# Patient Record
Sex: Male | Born: 1965 | Race: White | Hispanic: No | Marital: Single | Smoking: Former smoker
Health system: Southern US, Community
[De-identification: ages and names within clinical notes are randomized; demographics above are authoritative.]

## PROBLEM LIST (undated history)

## (undated) DIAGNOSIS — F32A Depression, unspecified: Secondary | ICD-10-CM

## (undated) DIAGNOSIS — F329 Major depressive disorder, single episode, unspecified: Secondary | ICD-10-CM

## (undated) DIAGNOSIS — J449 Chronic obstructive pulmonary disease, unspecified: Secondary | ICD-10-CM

## (undated) HISTORY — DX: Depression, unspecified: F32.A

## (undated) HISTORY — DX: Major depressive disorder, single episode, unspecified: F32.9

## (undated) HISTORY — DX: Chronic obstructive pulmonary disease, unspecified: J44.9

---

## 2000-09-25 ENCOUNTER — Encounter: Payer: Self-pay | Admitting: Internal Medicine

## 2000-09-25 ENCOUNTER — Emergency Department (HOSPITAL_COMMUNITY): Admission: EM | Admit: 2000-09-25 | Discharge: 2000-09-25 | Payer: Self-pay | Admitting: *Deleted

## 2010-10-14 ENCOUNTER — Emergency Department (HOSPITAL_COMMUNITY): Admission: EM | Admit: 2010-10-14 | Discharge: 2010-10-14 | Payer: Self-pay | Admitting: Emergency Medicine

## 2010-10-24 ENCOUNTER — Ambulatory Visit (HOSPITAL_COMMUNITY)
Admission: RE | Admit: 2010-10-24 | Discharge: 2010-10-24 | Payer: Self-pay | Source: Home / Self Care | Admitting: Orthopedic Surgery

## 2011-01-31 LAB — URINALYSIS, ROUTINE W REFLEX MICROSCOPIC
Bilirubin Urine: NEGATIVE
Glucose, UA: NEGATIVE mg/dL
Hgb urine dipstick: NEGATIVE
Ketones, ur: NEGATIVE mg/dL
Nitrite: NEGATIVE
Protein, ur: NEGATIVE mg/dL
Specific Gravity, Urine: 1.028 (ref 1.005–1.030)
Urobilinogen, UA: 0.2 mg/dL (ref 0.0–1.0)
pH: 5.5 (ref 5.0–8.0)

## 2011-09-09 IMAGING — US US SCROTUM
1 series · 14 of 25 positions shown · non-contrast
Comparison: None.

CLINICAL DATA: Left scrotal tingling for 3 months; back pain.

SCROTAL ULTRASOUND
DOPPLER ULTRASOUND OF THE TESTICLES
TECHNIQUE: Complete ultrasound examination of the testicles,
epididymis, and other scrotal structures was performed.  Color and
spectral Doppler ultrasound were also utilized to evaluate blood
flow to the testicles.

[Series 1: us scrotum · 0.07mm/px · 14 of 29 slices shown]
[im 1/29]
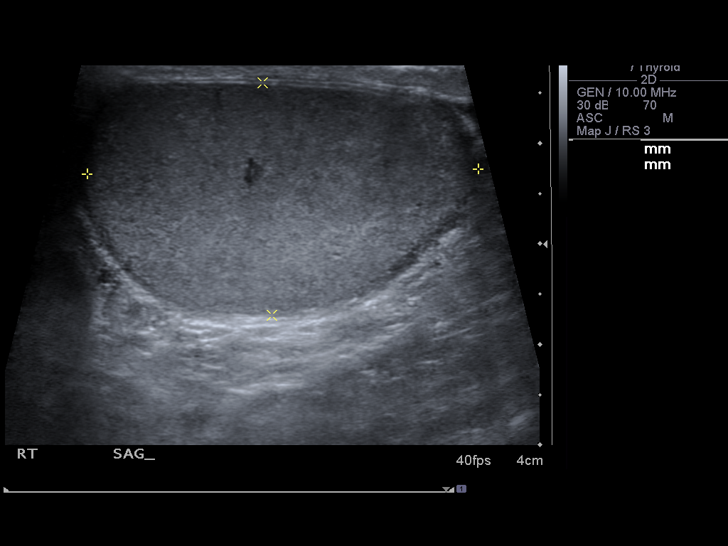
[im 3/29]
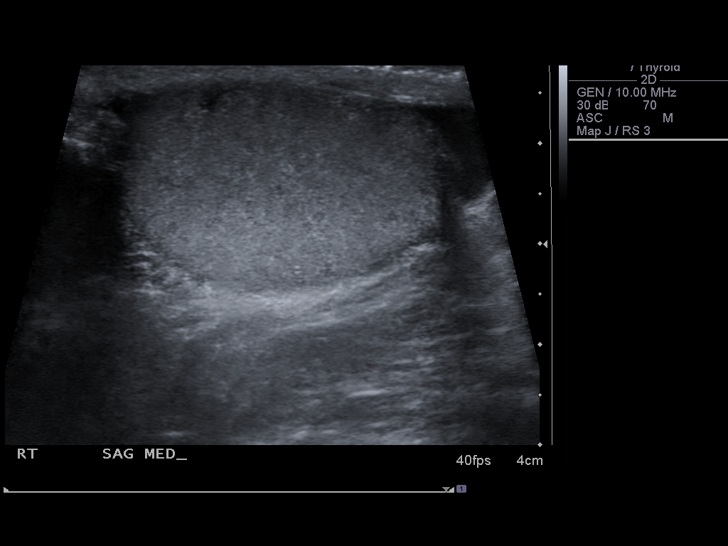
[im 5/29]
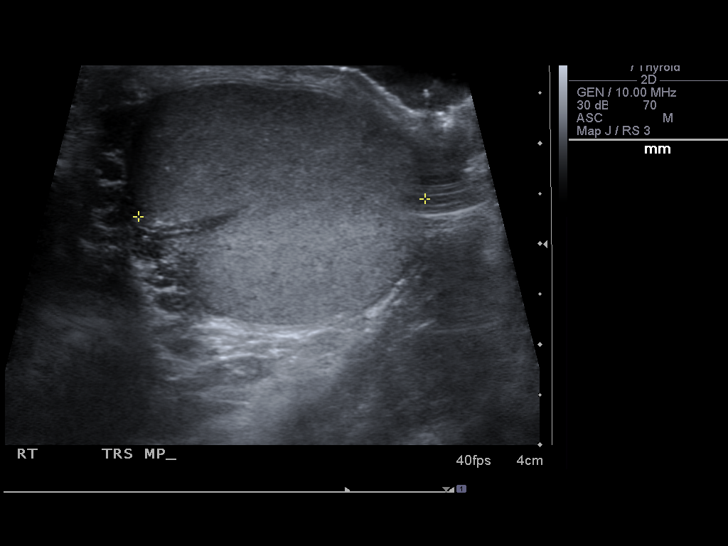
[im 8/29]
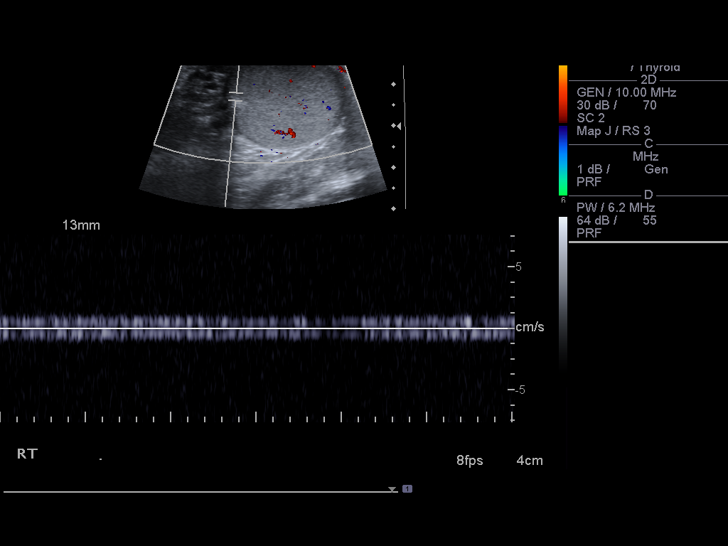
[im 10/29]
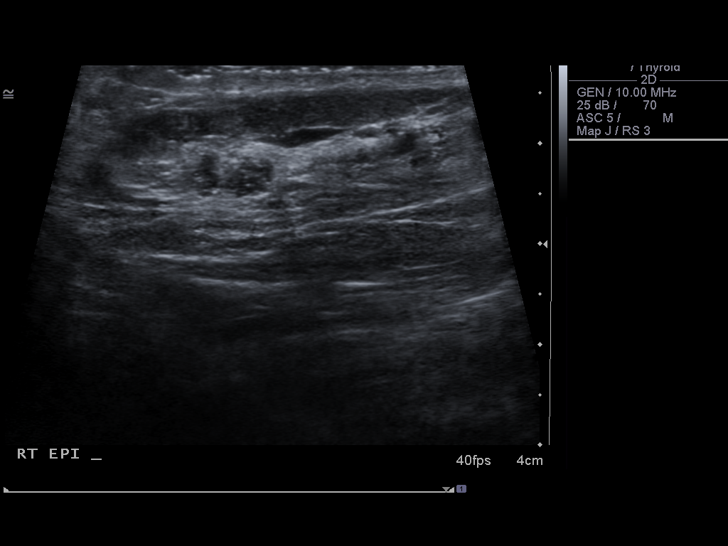
[im 11/29]
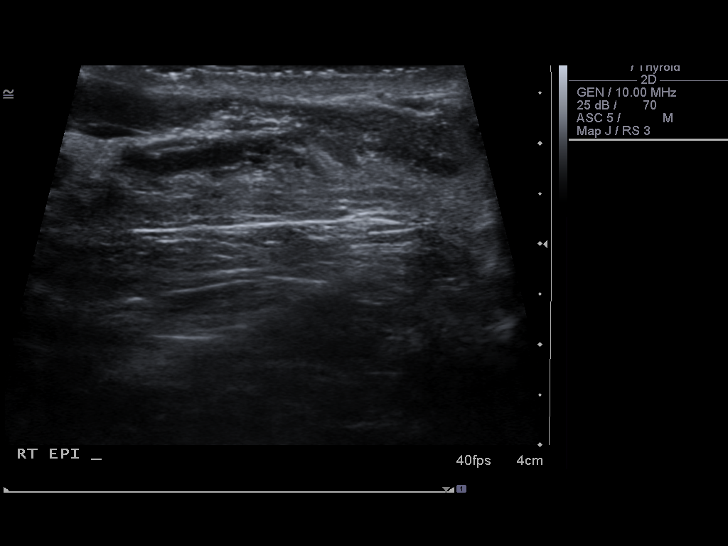
[im 13/29]
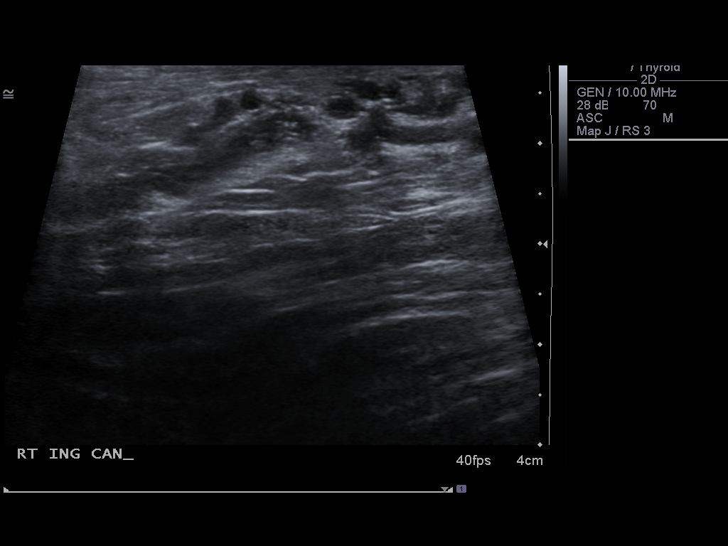
[im 16/29]
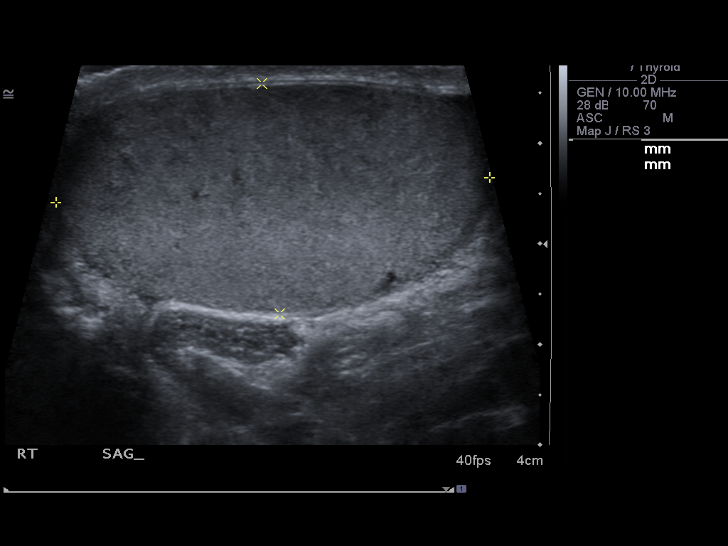
[im 18/29]
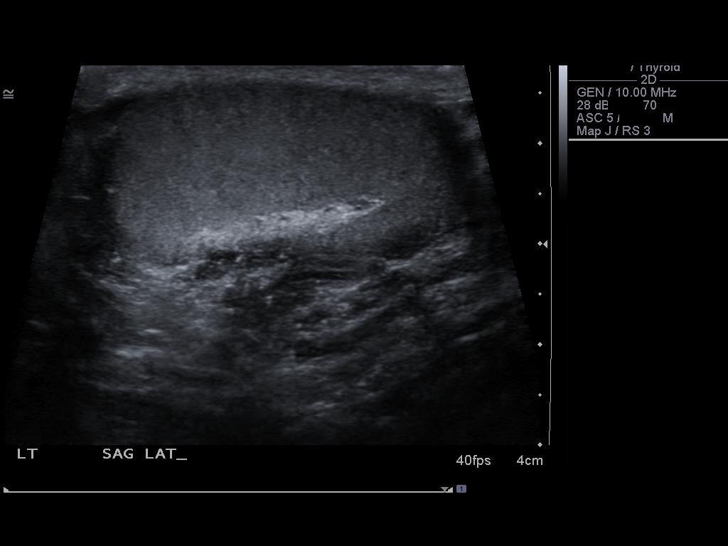
[im 19/29]
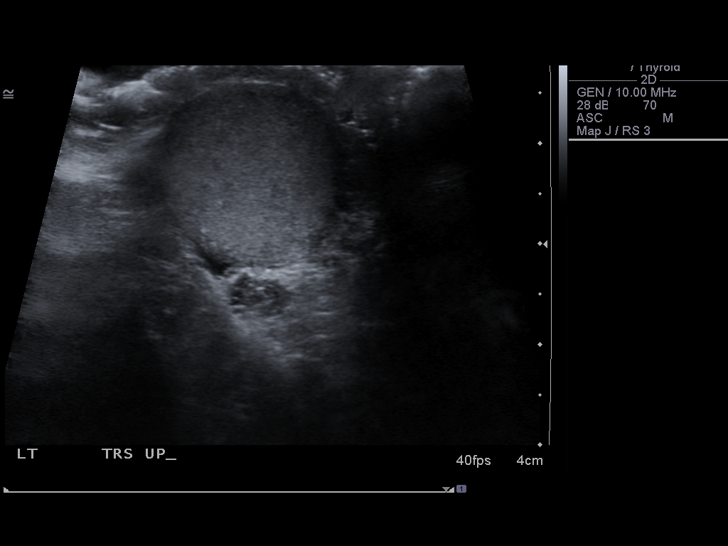
[im 22/29]
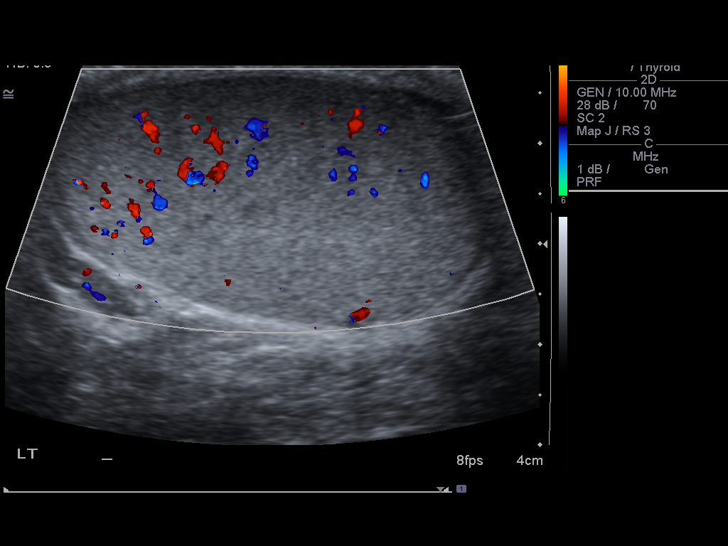
[im 24/29]
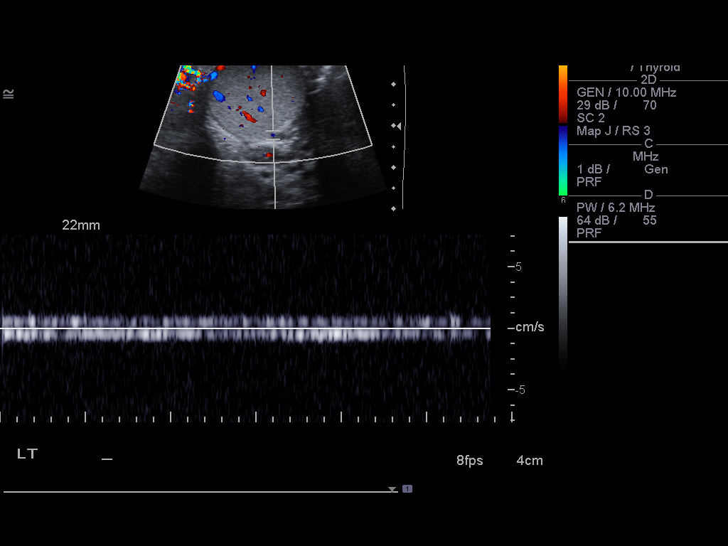
[im 26/29]
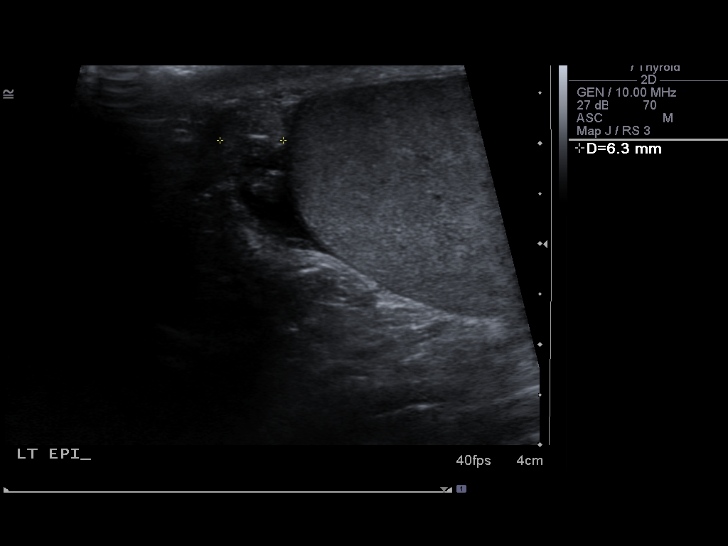
[im 29/29]
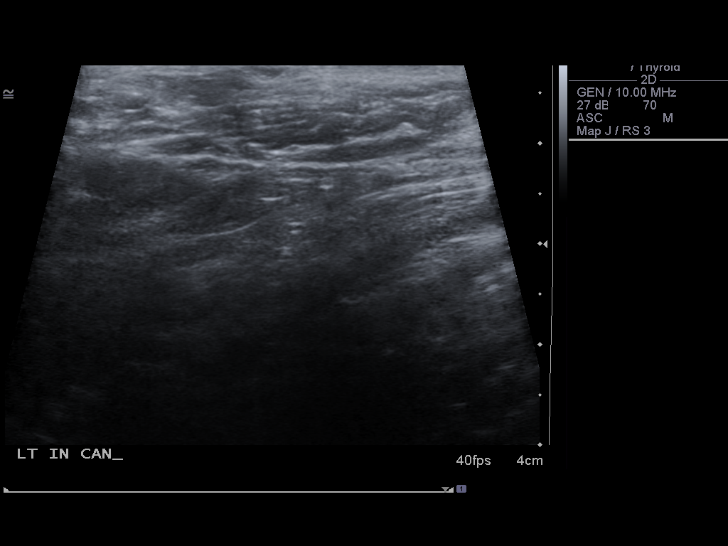

[14 of 25 positions shown; findings below may reference images not displayed]

FINDINGS: The testicles are symmetric in size and echogenicity.
The right testis measures 3.9 x 2.3 x 2.9 cm, while the left testis
measures 4.3 x 2.3 x 2.6 cm.  No testicular masses are seen, and
there is no evidence of microlithiasis.  The right epididymal head
is unremarkable in appearance.  A 7 mm anechoic cyst is noted
within the left epididymal head; the epididymides are otherwise
unremarkable.

There is no evidence of hydrocele, varicocele, or other extra-
testicular abnormality.

Blood flow is seen within both testicles on color Doppler
sonography.  Doppler spectral waveforms show both arterial and
venous flow signal in both testicles.
IMPRESSION: 1.  No evidence of testicular torsion.
2.  Left epididymal head cyst noted.

Critical test results telephoned to Dr. Ketia Saha at the time of

## 2013-03-05 ENCOUNTER — Ambulatory Visit (INDEPENDENT_AMBULATORY_CARE_PROVIDER_SITE_OTHER): Payer: BC Managed Care – PPO | Admitting: Emergency Medicine

## 2013-03-05 VITALS — BP 144/90 | HR 80 | Temp 98.3°F | Resp 18 | Wt 300.0 lb

## 2013-03-05 DIAGNOSIS — J309 Allergic rhinitis, unspecified: Secondary | ICD-10-CM

## 2013-03-05 MED ORDER — MOMETASONE FUROATE 50 MCG/ACT NA SUSP: 4.0000 | Freq: Every day | NASAL | Status: DC

## 2013-03-05 MED ORDER — FEXOFENADINE HCL 180 MG PO TABS: 180.0000 mg | ORAL_TABLET | Freq: Every day | ORAL | Status: AC

## 2013-03-05 NOTE — Progress Notes (Signed)
[facility] [address], [place] [address] [phone]  Date:  03/05/2013   Name:  Stanley Villarreal   DOB:  01/18/1966   MRN:  [id number]  PCP:  No primary provider on file.    Chief Complaint: Sinusitis and Sore Throat   History of Present Illness:  Stanley Villarreal is a 47 y.o. very pleasant male patient who presents with the following:  Ill past few days with clear nasal drainage and post nasal drainage.  Has sore throat and pain in ears and pressure in his maxillary sinuses.  No fever or chills.  No cough, wheezing or shortness of breath.  No improvement with over the counter medications or other home remedies. Denies other complaint or health concern today.   There is no problem list on file for this patient.   Past Medical History  Diagnosis Date  . Depression     No past surgical history on file.  History  Substance Use Topics  . Smoking status: Former Games developer  . Smokeless tobacco: Not on file  . Alcohol Use: Yes    No family history on file.  No Known Allergies  Medication list has been reviewed and updated.  No current outpatient prescriptions on file prior to visit.   No current facility-administered medications on file prior to visit.    Review of Systems:  As per HPI, otherwise negative.    Physical Examination: Filed Vitals:   03/05/13 1752  BP: 144/90  Pulse: 80  Temp: 98.3 F (36.8 C)  Resp: 18   Filed Vitals:   03/05/13 1752  Weight: 300 lb (136.079 kg)   There is no height on file to calculate BMI. Ideal Body Weight:   GEN: WDWN, NAD, Non-toxic, A & O x 3 HEENT: Atraumatic, Normocephalic. Neck supple. No masses, No LAD. Ears and Nose: No external deformity. CV: RRR, No M/G/R. No JVD. No thrill. No extra heart sounds. PULM: CTA B, no wheezes, crackles, rhonchi. No retractions. No resp. distress. No accessory muscle use. ABD: S, NT, ND, +BS. No rebound. No HSM. EXTR: No c/c/e NEURO Normal gait.  PSYCH:  Normally interactive. Conversant. Not depressed or anxious appearing.  Calm demeanor.    Assessment and Plan: Seasonal allergic rhinitis nasonex Allegra   Signed,  Phillips Odor, MD

## 2013-03-05 NOTE — Patient Instructions (Addendum)
Allergic Rhinitis  Allergic rhinitis is when the mucous membranes in the nose respond to allergens. Allergens are particles in the air that cause your body to have an allergic reaction. This causes you to release allergic antibodies. Through a chain of events, these eventually cause you to release histamine into the blood stream (hence the use of antihistamines). Although meant to be protective to the body, it is this release that causes your discomfort, such as frequent sneezing, congestion and an itchy runny nose.    CAUSES    The pollen allergens may come from grasses, trees, and weeds. This is seasonal allergic rhinitis, or "hay fever." Other allergens cause year-round allergic rhinitis (perennial allergic rhinitis) such as house dust mite allergen, pet dander and mold spores.    SYMPTOMS     Nasal stuffiness (congestion).   Runny, itchy nose with sneezing and tearing of the eyes.   There is often an itching of the mouth, eyes and ears.  It cannot be cured, but it can be controlled with medications.  DIAGNOSIS    If you are unable to determine the offending allergen, skin or blood testing may find it.  TREATMENT     Avoid the allergen.   Medications and allergy shots (immunotherapy) can help.   Hay fever may often be treated with antihistamines in pill or nasal spray forms. Antihistamines block the effects of histamine. There are over-the-counter medicines that may help with nasal congestion and swelling around the eyes. Check with your caregiver before taking or giving this medicine.  If the treatment above does not work, there are many new medications your caregiver can prescribe. Stronger medications may be used if initial measures are ineffective. Desensitizing injections can be used if medications and avoidance fails. Desensitization is when a patient is given ongoing shots until the body becomes less sensitive to the allergen. Make sure you follow up with your caregiver if problems continue.   SEEK MEDICAL CARE IF:     You develop fever (more than 100.5 F (38.1 C).   You develop a cough that does not stop easily (persistent).   You have shortness of breath.   You start wheezing.   Symptoms interfere with normal daily activities.  Document Released: 08/01/2001 Document Revised: 01/29/2012 Document Reviewed: 02/10/2009  ExitCare Patient Information 2013 [employer].

## 2014-03-28 ENCOUNTER — Ambulatory Visit (INDEPENDENT_AMBULATORY_CARE_PROVIDER_SITE_OTHER): Payer: BC Managed Care – PPO | Admitting: Family Medicine

## 2014-03-28 VITALS — BP 136/84 | HR 73 | Temp 98.2°F | Resp 16 | Ht 72.05 in | Wt 213.6 lb

## 2014-03-28 DIAGNOSIS — H5789 Other specified disorders of eye and adnexa: Secondary | ICD-10-CM

## 2014-03-28 DIAGNOSIS — H579 Unspecified disorder of eye and adnexa: Secondary | ICD-10-CM

## 2014-03-28 NOTE — Progress Notes (Signed)
Chief Complaint:  Chief Complaint  Patient presents with  . ?FB to left eye    eye dries out, feels like this moves around x 4 days.      HPI: Stanley Villarreal is a 48 y.o. male who is here for  4 day history of feeling something in his eye, he was helpin his son work on car when this happened, he washed in the shower and though tit was water and chlorine but after4 days he has not gotten beter. He has no eye redness, swelling or pain. Just  afeeling of discomofrt. He   Past Medical History  Diagnosis Date  . Depression    No past surgical history on file. History   Social History  . Marital Status: Single    Spouse Name: N/A    Number of Children: N/A  . Years of Education: N/A   Social History Main Topics  . Smoking status: Former Smoker    Quit date: 08/23/2009  . Smokeless tobacco: None  . Alcohol Use: Yes  . Drug Use: No  . Sexual Activity: None   Other Topics Concern  . None   Social History Narrative  . None   No family history on file. No Known Allergies Prior to Admission medications   Medication Sig Start Date End Date Taking? Authorizing Provider  amphetamine-dextroamphetamine (ADDERALL) 20 MG tablet Take 20 mg by mouth daily.   Yes Historical Provider, MD  citalopram (CELEXA) 40 MG tablet Take 40 mg by mouth daily.   Yes Historical Provider, MD  fexofenadine (ALLEGRA) 180 MG tablet Take 1 tablet (180 mg total) by mouth daily. 03/05/13  Yes Phillips OdorJeffery Anderson, MD  mometasone (NASONEX) 50 MCG/ACT nasal spray Place 4 sprays into the nose daily. 03/05/13  Yes Phillips OdorJeffery Anderson, MD     ROS: The patient denies fevers, chills, night sweats, unintentional weight loss, chest pain, palpitations, wheezing, dyspnea on exertion, nausea, vomiting, abdominal pain, dysuria, hematuria, melena, numbness, weakness, or tingling.   All other systems have been reviewed and were otherwise negative with the exception of those mentioned in the HPI and as above.    PHYSICAL  EXAM: Filed Vitals:   03/28/14 1508  BP: 136/84  Pulse: 73  Temp: 98.2 F (36.8 C)  Resp: 16   Filed Vitals:   03/28/14 1508  Height: 6' 0.05" (1.83 m)  Weight: 213 lb 9.6 oz (96.888 kg)   Body mass index is 28.93 kg/(m^2).  General: Alert, no acute distress HEENT:  Normocephalic, atraumatic, oropharynx patent. EOMI, PERRLA, fundo exam nl, no conjunctivitis, no foreign objects visualized Tm nl Cardiovascular:  Regular rate and rhythm, no rubs murmurs or gallops.  No Carotid bruits, radial pulse intact. No pedal edema.  Respiratory: Clear to auscultation bilaterally.  No wheezes, rales, or rhonchi.  No cyanosis, no use of accessory musculature GI: No organomegaly, abdomen is soft and non-tender, positive bowel sounds.  No masses. Skin: No rashes. Neurologic: Facial musculature symmetric. Psychiatric: Patient is appropriate throughout our interaction. Lymphatic: No cervical lymphadenopathy Musculoskeletal: Gait intact.   LABS: Results for orders placed during the hospital encounter of 10/14/10  URINALYSIS, ROUTINE W REFLEX MICROSCOPIC      Result Value Ref Range   Color, Urine YELLOW  YELLOW   APPearance CLEAR  CLEAR   Specific Gravity, Urine 1.028  1.005 - 1.030   pH 5.5  5.0 - 8.0   Glucose, UA NEGATIVE  NEGATIVE mg/dL   Hgb urine dipstick NEGATIVE  NEGATIVE   Bilirubin Urine NEGATIVE  NEGATIVE   Ketones, ur NEGATIVE  NEGATIVE mg/dL   Protein, ur NEGATIVE  NEGATIVE mg/dL   Urobilinogen, UA 0.2  0.0 - 1.0 mg/dL   Nitrite NEGATIVE  NEGATIVE   Leukocytes, UA    NEGATIVE   Value: NEGATIVE MICROSCOPIC NOT DONE ON URINES WITH NEGATIVE PROTEIN, BLOOD, LEUKOCYTES, NITRITE, OR GLUCOSE <1000 mg/dL.     EKG/XRAY:   [facility]   ASSESSMENT/PLAN: Encounter Diagnosis  Name Primary?  . Irritation of left eye Yes   Fundo exam and fluorscein eaxm normal Did not see any foreign object Flushed and gave 2 drops of abx He can call if needs optho  referral or F/u prn  Gross sideeffects, risk and benefits, and alternatives of medications d/w patient. Patient is aware that all medications have potential sideeffects and we are unable to predict every sideeffect or drug-drug interaction that may occur.  Lenell Antuhao P Warnell Rasnic, DO 03/28/2014 4:29 PM

## 2014-04-25 ENCOUNTER — Ambulatory Visit (INDEPENDENT_AMBULATORY_CARE_PROVIDER_SITE_OTHER): Payer: BC Managed Care – PPO | Admitting: Physician Assistant

## 2014-04-25 VITALS — BP 124/82 | HR 87 | Temp 98.1°F | Resp 16 | Ht 73.0 in | Wt 310.0 lb

## 2014-04-25 DIAGNOSIS — H6980 Other specified disorders of Eustachian tube, unspecified ear: Secondary | ICD-10-CM

## 2014-04-25 DIAGNOSIS — R059 Cough, unspecified: Secondary | ICD-10-CM

## 2014-04-25 DIAGNOSIS — Q178 Other specified congenital malformations of ear: Secondary | ICD-10-CM

## 2014-04-25 DIAGNOSIS — R05 Cough: Secondary | ICD-10-CM

## 2014-04-25 MED ORDER — AMOXICILLIN-POT CLAVULANATE 875-125 MG PO TABS: 1.0000 | ORAL_TABLET | Freq: Two times a day (BID) | ORAL | Status: DC

## 2014-04-25 MED ORDER — PREDNISONE 20 MG PO TABS: ORAL_TABLET | ORAL | Status: DC

## 2014-04-25 MED ORDER — HYDROCODONE-HOMATROPINE 5-1.5 MG/5ML PO SYRP: ORAL_SOLUTION | ORAL | Status: DC

## 2014-04-25 NOTE — Progress Notes (Signed)
Subjective:    Patient ID: Stanley Villarreal, male    DOB: [birth year], 48 y.o.   MRN: [id number]  HPI Primary Physician: No primary provider on file.  Chief Complaint: URI x 2 weeks  HPI: 48 y.o. male with history below presents with 2 week history of nasal congestion, sore throat, muffled hearing, cough, fever, chills, and fatigue. Cough is productive of bright yellow to brown sputum and not associated with time of day. No SOB or wheezing. Cough seems to be coming from post nasal drip. Having to say "what" or "huh" frequently. Ears will pop a little. Sounds like he has had frequent issues with eustachian tubes.    No known sick contacts. Has been taking Mucinex with good results.     Past Medical History  Diagnosis Date  . Depression      Home Meds: Prior to Admission medications   Medication Sig Start Date End Date Taking? Authorizing Provider  amphetamine-dextroamphetamine (ADDERALL) 20 MG tablet Take 20 mg by mouth daily.   Yes Historical Provider, MD  citalopram (CELEXA) 40 MG tablet Take 40 mg by mouth daily.   Yes Historical Provider, MD  fexofenadine (ALLEGRA) 180 MG tablet Take 1 tablet (180 mg total) by mouth daily. 03/05/13  Yes Phillips Odor, MD  mometasone (NASONEX) 50 MCG/ACT nasal spray Place 4 sprays into the nose daily. 03/05/13   Phillips Odor, MD    Allergies: No Known Allergies  History   Social History  . Marital Status: Single    Spouse Name: N/A    Number of Children: N/A  . Years of Education: N/A   Occupational History  . Not on file.   Social History Main Topics  . Smoking status: Former Smoker    Quit date: 08/23/2009  . Smokeless tobacco: Not on file  . Alcohol Use: Yes  . Drug Use: No  . Sexual Activity: Not on file   Other Topics Concern  . Not on file   Social History Narrative  . No narrative on file     Review of Systems  Constitutional: Positive for fever, chills and fatigue. Negative for appetite change.  HENT: Positive  for congestion, hearing loss, postnasal drip, sore throat and tinnitus. Negative for ear pain, rhinorrhea and sinus pressure.        Scratchy throat.    Respiratory: Positive for cough. Negative for shortness of breath and wheezing.        Cough is productive of bright yellow to brown. Cough is not associated with time of day.   Gastrointestinal: Negative for nausea, vomiting and diarrhea.  Musculoskeletal: Negative for myalgias.  Neurological: Negative for headaches.       Objective:   Physical Exam  Physical Exam: Blood pressure 124/82, pulse 87, temperature 98.1 F (36.7 C), temperature source Oral, resp. rate 16, height 6\' 1"  (1.854 m), weight 310 lb (140.615 kg), SpO2 96.00%., Body mass index is 40.91 kg/(m^2). General: Well developed, well nourished, in no acute distress. Head: Normocephalic, atraumatic, eyes without discharge, sclera non-icteric, nares are without discharge. Bilateral auditory canals clear, TM's are without perforation, pearly grey and translucent with reflective cone of light bilaterally and retracted. Mild left sided maxillary sinus TTP. Oral cavity moist, posterior pharynx with post nasal drip. No exudate, erythema, or peritonsillar abscess. Uvula midline.   Neck: Supple. No thyromegaly. Full ROM. No lymphadenopathy. No nuchal rigidity.  Lungs: Clear bilaterally to auscultation without wheezes, rales, or rhonchi. Breathing is unlabored. Heart: RRR with S1 S2. No  murmurs, rubs, or gallops appreciated. Msk:  Strength and tone normal for age. Extremities/Skin: Warm and dry. No clubbing or cyanosis. No edema. No rashes or suspicious lesions. Neuro: Alert and oriented X 3. Moves all extremities spontaneously. Gait is normal. CNII-XII grossly in tact. Psych:  Responds to questions appropriately with a normal affect.        Assessment & Plan:  48 year old male with eustachian tube dysfunction and cough -Augmentin 875/125 mg 1 po bid #20 no RF -Prednisone 20 mg #18  3x3, 2x3, 1x3 no RF, SED -Hycodan #4oz 1 tsp po q 4-6 hours prn cough no RF, SED -Rest -RTC precautions   Eula Listenyan Todrick Siedschlag, MHS, PA-C [facility] [address] [place], [place] [address] [phone] [facility] 04/25/2014 3:15 PM

## 2015-01-10 ENCOUNTER — Ambulatory Visit (INDEPENDENT_AMBULATORY_CARE_PROVIDER_SITE_OTHER): Payer: BLUE CROSS/BLUE SHIELD | Admitting: Urgent Care

## 2015-01-10 VITALS — BP 130/82 | HR 85 | Temp 98.2°F | Resp 20 | Ht 73.0 in | Wt 321.0 lb

## 2015-01-10 DIAGNOSIS — R0982 Postnasal drip: Secondary | ICD-10-CM

## 2015-01-10 DIAGNOSIS — H6983 Other specified disorders of Eustachian tube, bilateral: Secondary | ICD-10-CM

## 2015-01-10 DIAGNOSIS — Z889 Allergy status to unspecified drugs, medicaments and biological substances status: Secondary | ICD-10-CM

## 2015-01-10 DIAGNOSIS — J029 Acute pharyngitis, unspecified: Secondary | ICD-10-CM

## 2015-01-10 LAB — POCT CBC
Granulocyte percent: 56.6 %G (ref 37–80)
HCT, POC: 46.4 % (ref 43.5–53.7)
HEMOGLOBIN: 15.5 g/dL (ref 14.1–18.1)
Lymph, poc: 2.9 (ref 0.6–3.4)
MCH: 30.8 pg (ref 27–31.2)
MCHC: 33.4 g/dL (ref 31.8–35.4)
MCV: 92.2 fL (ref 80–97)
MID (cbc): 0.6 (ref 0–0.9)
MPV: 7.8 fL (ref 0–99.8)
POC GRANULOCYTE: 4.6 (ref 2–6.9)
POC LYMPH %: 35.7 % (ref 10–50)
POC MID %: 7.7 % (ref 0–12)
Platelet Count, POC: 273 10*3/uL (ref 142–424)
RBC: 5.03 M/uL (ref 4.69–6.13)
RDW, POC: 14.5 %
WBC: 8.1 10*3/uL (ref 4.6–10.2)

## 2015-01-10 MED ORDER — CETIRIZINE HCL 10 MG PO TABS: 10.0000 mg | ORAL_TABLET | Freq: Every day | ORAL | Status: AC

## 2015-01-10 MED ORDER — TRIAMCINOLONE ACETONIDE 55 MCG/ACT NA AERO: 2.0000 | INHALATION_SPRAY | Freq: Every day | NASAL | Status: AC

## 2015-01-10 NOTE — Progress Notes (Signed)
MRN: [id number] DOB: 19-Jan-1966  Subjective:   Stanley Villarreal is a 49 y.o. male presenting for chief complaint of Sore Throat  Reports a 2 week history of sore throat. Was steady until yesterday, started getting worse after a sneezing fit. Has tried gargling saltwater with minimal relief. Denies fevers, sinus congestion, sinus pain, ear pain, ear drainage, cough, chest pain, shob, wheezing, abdominal pain, n/v. Wife also has cold symptoms. Has a history of allergies, managed with Allegra but admits he is not consistent with this, admits he has to pop his ears regularly. Denies history of GERD, asthma. No smoking, 6-8 beers per week. Denies any other aggravating or relieving factors, no other questions or concerns.  Stanley Villarreal has a current medication list which includes the following prescription(s): amphetamine-dextroamphetamine, citalopram, and fexofenadine.  He has No Known Allergies.  Stanley Villarreal  has a past medical history of Depression. Also  has no past surgical history on file.  ROS As in subjective.  Objective:   Vitals: BP 130/82 mmHg  Pulse 85  Temp(Src) 98.2 F (36.8 C) (Oral)  Resp 20  Ht 6\' 1"  (1.854 m)  Wt 321 lb (145.605 kg)  BMI 42.36 kg/m2  SpO2 96%  Physical Exam  Constitutional: He is oriented to person, place, and time and well-developed, well-nourished, and in no distress.  HENT:  TM's flat bilaterally, no effusions or erythema. Nares patent, nasal turbinates pink and moist, also slightly edematous, small amount of clear rhinorrhea. Postnasal drip present, without tonsillar enlargement or oropharyngeal exudates. Mucous membranes moist.  Eyes: Conjunctivae are normal. Right eye exhibits no discharge. Left eye exhibits no discharge. No scleral icterus.  Neck: Neck supple.  Cardiovascular: Normal rate, regular rhythm, normal heart sounds and intact distal pulses.  Exam reveals no gallop and no friction rub.   No murmur heard. Pulmonary/Chest: Effort normal and  breath sounds normal. No stridor. No respiratory distress. He has no wheezes. He has no rales. He exhibits no tenderness.  Lymphadenopathy:    He has no cervical adenopathy.  Neurological: He is alert and oriented to person, place, and time.  Skin: Skin is warm and dry. No rash noted. No erythema. No pallor.   Results for orders placed or performed in visit on 01/10/15 (from the past 24 hour(s))  POCT CBC     Status: None   Collection Time: 01/10/15  2:24 PM  Result Value Ref Range   WBC 8.1 4.6 - 10.2 K/uL   Lymph, poc 2.9 0.6 - 3.4   POC LYMPH PERCENT 35.7 10 - 50 %L   MID (cbc) 0.6 0 - 0.9   POC MID % 7.7 0 - 12 %M   POC Granulocyte 4.6 2 - 6.9   Granulocyte percent 56.6 37 - 80 %G   RBC 5.03 4.69 - 6.13 M/uL   Hemoglobin 15.5 14.1 - 18.1 g/dL   HCT, POC 14.746.4 82.943.5 - 53.7 %   MCV 92.2 80 - 97 fL   MCH, POC 30.8 27 - 31.2 pg   MCHC 33.4 31.8 - 35.4 g/dL   RDW, POC 56.214.5 %   Platelet Count, POC 273 142 - 424 K/uL   MPV 7.8 0 - 99.8 fL   Assessment and Plan :   1. Sore throat 2. Postnasal drip 3. History of seasonal allergies 4. Eustachian tube dysfunction, bilateral - Physical exam findings and CBC reassuring for r/o infectious process - Advised better control of allergies since sore throat likely due to postnasal drip secondary to  allergies - Counseled patient on ETD - Start nasal steroid, switch to Zyrtec for better allergy control; advised to avoid having his dogs, who spend plenty of time outdoors in his bedroom - Follow up in 2 weeks if no symptom improvement or sooner if worsening symptoms develop  Wallis Bamberg, PA-C [facility] [phone] 01/10/2015 2:28 PM

## 2015-01-11 ENCOUNTER — Encounter: Payer: Self-pay | Admitting: Urgent Care

## 2015-02-03 ENCOUNTER — Ambulatory Visit (INDEPENDENT_AMBULATORY_CARE_PROVIDER_SITE_OTHER): Payer: BLUE CROSS/BLUE SHIELD | Admitting: Family Medicine

## 2015-02-03 VITALS — BP 148/98 | HR 80 | Temp 98.1°F | Resp 18 | Ht 72.5 in | Wt 311.0 lb

## 2015-02-03 DIAGNOSIS — J029 Acute pharyngitis, unspecified: Secondary | ICD-10-CM | POA: Diagnosis not present

## 2015-02-03 DIAGNOSIS — J069 Acute upper respiratory infection, unspecified: Secondary | ICD-10-CM | POA: Diagnosis not present

## 2015-02-03 LAB — POCT RAPID STREP A (OFFICE): Rapid Strep A Screen: NEGATIVE

## 2015-02-03 MED ORDER — AMOXICILLIN-POT CLAVULANATE 875-125 MG PO TABS: 1.0000 | ORAL_TABLET | Freq: Two times a day (BID) | ORAL | Status: AC

## 2015-02-03 NOTE — Patient Instructions (Signed)

## 2015-02-03 NOTE — Progress Notes (Signed)
Chief Complaint:  Chief Complaint  Patient presents with  . Sore Throat    no better    HPI: Stanley Villarreal is a 49 y.o. male who is here for sore throat 21st 2016. His told to take Zyrtec and steroid nasal spray and that has not worked. He had necrotic and has had upper respiratory symptoms, nasal congestion and also diarrhea. He was getting better with that lasts 3-4 days he hasn't had much symptoms of fevers or diarrhea. His nasal drainage and also throat pain has worsened. Today worse while he was eating. He doesn't think that anything got stuck in it. But he felt like his heart is a little bit restricted at that time he is back to normal now. He had worsening tenderness on palpation just after lunch. Now it is better. He denies any voice changes, shortness of breath, wheezing, chest pain. He has left-sided ear pressure.  Past Medical History  Diagnosis Date  . Depression    History reviewed. No pertinent past surgical history. History   Social History  . Marital Status: Single    Spouse Name: N/A  . Number of Children: N/A  . Years of Education: N/A   Social History Main Topics  . Smoking status: Former Smoker    Quit date: 08/23/2009  . Smokeless tobacco: Never Used  . Alcohol Use: 0.0 oz/week    0 Standard drinks or equivalent per week  . Drug Use: No  . Sexual Activity: Not on file   Other Topics Concern  . None   Social History Narrative   History reviewed. No pertinent family history. No Known Allergies Prior to Admission medications   Medication Sig Start Date End Date Taking? Authorizing Provider  amphetamine-dextroamphetamine (ADDERALL) 20 MG tablet Take 20 mg by mouth daily.   Yes Historical Provider, MD  citalopram (CELEXA) 40 MG tablet Take 40 mg by mouth daily.   Yes Historical Provider, MD  triamcinolone (NASACORT AQ) 55 MCG/ACT AERO nasal inhaler Place 2 sprays into the nose daily. 01/10/15  Yes Wallis Bamberg, PA-C  cetirizine (ZYRTEC) 10 MG tablet  Take 1 tablet (10 mg total) by mouth daily. Patient not taking: Reported on 02/03/2015 01/10/15   Wallis Bamberg, PA-C  fexofenadine (ALLEGRA) 180 MG tablet Take 1 tablet (180 mg total) by mouth daily. Patient not taking: Reported on 02/03/2015 03/05/13   Carmelina Dane, MD     ROS: The patient denies night sweats, unintentional weight loss, chest pain, palpitations, wheezing, dyspnea on exertion, nausea, vomiting, abdominal pain, dysuria, hematuria, melena, numbness, weakness, or tingling.   All other systems have been reviewed and were otherwise negative with the exception of those mentioned in the HPI and as above.    PHYSICAL EXAM: Filed Vitals:   02/03/15 1606  BP:   Pulse: 80  Temp:   Resp:    Filed Vitals:   02/03/15 1506  Height: 6' 0.5" (1.842 m)  Weight: 311 lb (141.069 kg)   Body mass index is 41.58 kg/(m^2).  General: Alert, no acute distress HEENT:  Normocephalic, atraumatic, oropharynx patent. EOMI, PERRLA Erythematous throat, no exudates, TM normal, +/- sinus tenderness, + erythematous/boggy nasal mucosa Cardiovascular:  Regular rate and rhythm, no rubs murmurs or gallops.  No Carotid bruits, radial pulse intact. No pedal edema.  Respiratory: Clear to auscultation bilaterally.  No wheezes, rales, or rhonchi.  No cyanosis, no use of accessory musculature GI: No organomegaly, abdomen is soft and non-tender, positive bowel sounds.  No masses. Skin: No rashes. Neurologic: Facial musculature symmetric. Psychiatric: Patient is appropriate throughout our interaction. Lymphatic: No cervical lymphadenopathy Musculoskeletal: Gait intact.   LABS: Results for orders placed or performed in visit on 02/03/15  POCT rapid strep A  Result Value Ref Range   Rapid Strep A Screen Negative Negative     EKG/XRAY:   Primary read [facility]   ASSESSMENT/PLAN: Encounter Diagnoses  Name Primary?  . Sore throat   . Acute upper respiratory infection Yes    Rx Augmentin Cepachol for throat pain No improvement and consider referral to ENT for his sore throat Follow-up when necessary  Gross sideeffects, risk and benefits, and alternatives of medications d/w patient. Patient is aware that all medications have potential sideeffects and we are unable to predict every sideeffect or drug-drug interaction that may occur.  Hamilton CapriLE, Naba Sneed PHUONG, DO 02/03/2015 5:46 PM

## 2015-02-05 LAB — CULTURE, GROUP A STREP: Organism ID, Bacteria: NORMAL

## 2017-12-21 DIAGNOSIS — I10 Essential (primary) hypertension: Secondary | ICD-10-CM | POA: Diagnosis not present

## 2018-01-25 DIAGNOSIS — M109 Gout, unspecified: Secondary | ICD-10-CM | POA: Diagnosis not present

## 2018-01-25 DIAGNOSIS — R062 Wheezing: Secondary | ICD-10-CM | POA: Diagnosis not present

## 2018-01-25 DIAGNOSIS — I10 Essential (primary) hypertension: Secondary | ICD-10-CM | POA: Diagnosis not present

## 2018-01-25 DIAGNOSIS — J449 Chronic obstructive pulmonary disease, unspecified: Secondary | ICD-10-CM | POA: Diagnosis not present

## 2018-02-22 DIAGNOSIS — Z9109 Other allergy status, other than to drugs and biological substances: Secondary | ICD-10-CM | POA: Diagnosis not present

## 2018-02-22 DIAGNOSIS — I10 Essential (primary) hypertension: Secondary | ICD-10-CM | POA: Diagnosis not present

## 2018-02-22 DIAGNOSIS — J449 Chronic obstructive pulmonary disease, unspecified: Secondary | ICD-10-CM | POA: Diagnosis not present

## 2018-03-14 DIAGNOSIS — F419 Anxiety disorder, unspecified: Secondary | ICD-10-CM | POA: Diagnosis not present

## 2018-03-14 DIAGNOSIS — F339 Major depressive disorder, recurrent, unspecified: Secondary | ICD-10-CM | POA: Diagnosis not present

## 2018-03-14 DIAGNOSIS — F909 Attention-deficit hyperactivity disorder, unspecified type: Secondary | ICD-10-CM | POA: Diagnosis not present

## 2018-05-24 DIAGNOSIS — Z131 Encounter for screening for diabetes mellitus: Secondary | ICD-10-CM | POA: Diagnosis not present

## 2018-05-24 DIAGNOSIS — Z0000 Encounter for general adult medical examination without abnormal findings: Secondary | ICD-10-CM | POA: Diagnosis not present

## 2018-05-24 DIAGNOSIS — I10 Essential (primary) hypertension: Secondary | ICD-10-CM | POA: Diagnosis not present

## 2018-05-24 DIAGNOSIS — E782 Mixed hyperlipidemia: Secondary | ICD-10-CM | POA: Diagnosis not present

## 2018-05-24 DIAGNOSIS — J449 Chronic obstructive pulmonary disease, unspecified: Secondary | ICD-10-CM | POA: Diagnosis not present

## 2018-05-24 DIAGNOSIS — Z1211 Encounter for screening for malignant neoplasm of colon: Secondary | ICD-10-CM | POA: Diagnosis not present

## 2018-05-24 DIAGNOSIS — Z23 Encounter for immunization: Secondary | ICD-10-CM | POA: Diagnosis not present

## 2018-06-13 DIAGNOSIS — F330 Major depressive disorder, recurrent, mild: Secondary | ICD-10-CM | POA: Diagnosis not present

## 2018-06-13 DIAGNOSIS — F900 Attention-deficit hyperactivity disorder, predominantly inattentive type: Secondary | ICD-10-CM | POA: Diagnosis not present

## 2018-06-13 DIAGNOSIS — F411 Generalized anxiety disorder: Secondary | ICD-10-CM | POA: Diagnosis not present

## 2018-09-09 DIAGNOSIS — F900 Attention-deficit hyperactivity disorder, predominantly inattentive type: Secondary | ICD-10-CM | POA: Diagnosis not present

## 2018-09-09 DIAGNOSIS — F330 Major depressive disorder, recurrent, mild: Secondary | ICD-10-CM | POA: Diagnosis not present

## 2018-09-09 DIAGNOSIS — F411 Generalized anxiety disorder: Secondary | ICD-10-CM | POA: Diagnosis not present

## 2018-11-27 DIAGNOSIS — R7303 Prediabetes: Secondary | ICD-10-CM | POA: Diagnosis not present

## 2018-11-27 DIAGNOSIS — J449 Chronic obstructive pulmonary disease, unspecified: Secondary | ICD-10-CM | POA: Diagnosis not present

## 2018-11-27 DIAGNOSIS — I10 Essential (primary) hypertension: Secondary | ICD-10-CM | POA: Diagnosis not present

## 2018-12-05 DIAGNOSIS — F411 Generalized anxiety disorder: Secondary | ICD-10-CM | POA: Diagnosis not present

## 2018-12-05 DIAGNOSIS — F900 Attention-deficit hyperactivity disorder, predominantly inattentive type: Secondary | ICD-10-CM | POA: Diagnosis not present

## 2018-12-05 DIAGNOSIS — F331 Major depressive disorder, recurrent, moderate: Secondary | ICD-10-CM | POA: Diagnosis not present

## 2019-06-03 DIAGNOSIS — Z20828 Contact with and (suspected) exposure to other viral communicable diseases: Secondary | ICD-10-CM | POA: Diagnosis not present

## 2019-06-03 DIAGNOSIS — J449 Chronic obstructive pulmonary disease, unspecified: Secondary | ICD-10-CM | POA: Diagnosis not present

## 2019-06-05 DIAGNOSIS — Z20828 Contact with and (suspected) exposure to other viral communicable diseases: Secondary | ICD-10-CM | POA: Diagnosis not present

## 2019-06-05 DIAGNOSIS — J449 Chronic obstructive pulmonary disease, unspecified: Secondary | ICD-10-CM | POA: Diagnosis not present

## 2019-07-02 DIAGNOSIS — E782 Mixed hyperlipidemia: Secondary | ICD-10-CM | POA: Diagnosis not present

## 2019-07-02 DIAGNOSIS — J449 Chronic obstructive pulmonary disease, unspecified: Secondary | ICD-10-CM | POA: Diagnosis not present

## 2019-07-02 DIAGNOSIS — Z0000 Encounter for general adult medical examination without abnormal findings: Secondary | ICD-10-CM | POA: Diagnosis not present

## 2019-07-02 DIAGNOSIS — I10 Essential (primary) hypertension: Secondary | ICD-10-CM | POA: Diagnosis not present

## 2019-07-02 DIAGNOSIS — F902 Attention-deficit hyperactivity disorder, combined type: Secondary | ICD-10-CM | POA: Diagnosis not present

## 2019-08-28 DIAGNOSIS — I10 Essential (primary) hypertension: Secondary | ICD-10-CM | POA: Diagnosis not present

## 2019-08-28 DIAGNOSIS — Z23 Encounter for immunization: Secondary | ICD-10-CM | POA: Diagnosis not present

## 2019-08-28 DIAGNOSIS — R7303 Prediabetes: Secondary | ICD-10-CM | POA: Diagnosis not present

## 2019-08-28 DIAGNOSIS — E782 Mixed hyperlipidemia: Secondary | ICD-10-CM | POA: Diagnosis not present

## 2019-08-28 DIAGNOSIS — Z125 Encounter for screening for malignant neoplasm of prostate: Secondary | ICD-10-CM | POA: Diagnosis not present

## 2019-09-01 DIAGNOSIS — Z1211 Encounter for screening for malignant neoplasm of colon: Secondary | ICD-10-CM | POA: Diagnosis not present

## 2019-11-04 DIAGNOSIS — R05 Cough: Secondary | ICD-10-CM | POA: Diagnosis not present

## 2019-11-04 DIAGNOSIS — R0602 Shortness of breath: Secondary | ICD-10-CM | POA: Diagnosis not present

## 2020-03-09 DIAGNOSIS — F902 Attention-deficit hyperactivity disorder, combined type: Secondary | ICD-10-CM | POA: Diagnosis not present

## 2020-03-09 DIAGNOSIS — J449 Chronic obstructive pulmonary disease, unspecified: Secondary | ICD-10-CM | POA: Diagnosis not present

## 2020-03-09 DIAGNOSIS — I10 Essential (primary) hypertension: Secondary | ICD-10-CM | POA: Diagnosis not present

## 2020-09-07 DIAGNOSIS — Z125 Encounter for screening for malignant neoplasm of prostate: Secondary | ICD-10-CM | POA: Diagnosis not present

## 2020-09-07 DIAGNOSIS — I10 Essential (primary) hypertension: Secondary | ICD-10-CM | POA: Diagnosis not present

## 2020-09-07 DIAGNOSIS — Z23 Encounter for immunization: Secondary | ICD-10-CM | POA: Diagnosis not present

## 2020-09-07 DIAGNOSIS — J449 Chronic obstructive pulmonary disease, unspecified: Secondary | ICD-10-CM | POA: Diagnosis not present

## 2020-09-07 DIAGNOSIS — E782 Mixed hyperlipidemia: Secondary | ICD-10-CM | POA: Diagnosis not present

## 2020-09-07 DIAGNOSIS — R7303 Prediabetes: Secondary | ICD-10-CM | POA: Diagnosis not present

## 2020-09-07 DIAGNOSIS — Z0000 Encounter for general adult medical examination without abnormal findings: Secondary | ICD-10-CM | POA: Diagnosis not present

## 2021-03-15 DIAGNOSIS — F3341 Major depressive disorder, recurrent, in partial remission: Secondary | ICD-10-CM | POA: Diagnosis not present

## 2021-03-15 DIAGNOSIS — R7303 Prediabetes: Secondary | ICD-10-CM | POA: Diagnosis not present

## 2021-03-15 DIAGNOSIS — J449 Chronic obstructive pulmonary disease, unspecified: Secondary | ICD-10-CM | POA: Diagnosis not present

## 2021-03-15 DIAGNOSIS — F902 Attention-deficit hyperactivity disorder, combined type: Secondary | ICD-10-CM | POA: Diagnosis not present

## 2021-03-15 DIAGNOSIS — I10 Essential (primary) hypertension: Secondary | ICD-10-CM | POA: Diagnosis not present

## 2021-09-14 DIAGNOSIS — Z23 Encounter for immunization: Secondary | ICD-10-CM | POA: Diagnosis not present

## 2021-09-14 DIAGNOSIS — M109 Gout, unspecified: Secondary | ICD-10-CM | POA: Diagnosis not present

## 2021-09-14 DIAGNOSIS — R7303 Prediabetes: Secondary | ICD-10-CM | POA: Diagnosis not present

## 2021-09-14 DIAGNOSIS — I10 Essential (primary) hypertension: Secondary | ICD-10-CM | POA: Diagnosis not present

## 2021-09-14 DIAGNOSIS — E782 Mixed hyperlipidemia: Secondary | ICD-10-CM | POA: Diagnosis not present

## 2021-09-14 DIAGNOSIS — Z125 Encounter for screening for malignant neoplasm of prostate: Secondary | ICD-10-CM | POA: Diagnosis not present

## 2021-09-14 DIAGNOSIS — Z0000 Encounter for general adult medical examination without abnormal findings: Secondary | ICD-10-CM | POA: Diagnosis not present

## 2021-09-20 DIAGNOSIS — Z1211 Encounter for screening for malignant neoplasm of colon: Secondary | ICD-10-CM | POA: Diagnosis not present

## 2022-03-22 DIAGNOSIS — I10 Essential (primary) hypertension: Secondary | ICD-10-CM | POA: Diagnosis not present

## 2022-03-22 DIAGNOSIS — F902 Attention-deficit hyperactivity disorder, combined type: Secondary | ICD-10-CM | POA: Diagnosis not present

## 2022-03-22 DIAGNOSIS — R7303 Prediabetes: Secondary | ICD-10-CM | POA: Diagnosis not present

## 2022-03-22 DIAGNOSIS — M109 Gout, unspecified: Secondary | ICD-10-CM | POA: Diagnosis not present

## 2022-07-20 DIAGNOSIS — M545 Low back pain, unspecified: Secondary | ICD-10-CM | POA: Diagnosis not present

## 2022-07-20 DIAGNOSIS — M47816 Spondylosis without myelopathy or radiculopathy, lumbar region: Secondary | ICD-10-CM | POA: Diagnosis not present

## 2022-07-20 DIAGNOSIS — M479 Spondylosis, unspecified: Secondary | ICD-10-CM | POA: Diagnosis not present

## 2022-08-01 ENCOUNTER — Encounter: Payer: Self-pay | Admitting: Physical Therapy

## 2022-08-01 ENCOUNTER — Ambulatory Visit: Payer: BC Managed Care – PPO | Attending: Anesthesiology | Admitting: Physical Therapy

## 2022-08-01 DIAGNOSIS — M5459 Other low back pain: Secondary | ICD-10-CM | POA: Insufficient documentation

## 2022-08-01 DIAGNOSIS — M6281 Muscle weakness (generalized): Secondary | ICD-10-CM | POA: Insufficient documentation

## 2022-08-01 NOTE — Therapy (Signed)
OUTPATIENT PHYSICAL THERAPY THORACOLUMBAR EVALUATION   Patient Name: Stanley Villarreal MRN: [id number] DOB:[birth year], 56 y.o., male Today's Date: 08/01/2022   PT End of Session - 08/02/22 1315     Visit Number 1    Number of Visits 8    Date for PT Re-Evaluation 08/31/22    Authorization Type BCBS; visit limit 30 per year    Progress Note Due on Visit 8    PT Start Time 1518    PT Stop Time 1602    PT Time Calculation (min) 44 min    Activity Tolerance No increased pain;Patient tolerated treatment well    Behavior During Therapy [facility] for tasks assessed/performed             Past Medical History:  Diagnosis Date   COPD (chronic obstructive pulmonary disease) (HCC)    Depression    History reviewed. No pertinent surgical history. There are no problems to display for this patient.   PCP: No primary care provider on file.  REFERRING PROVIDER: Windle Guard, MD  REFERRING DIAGNOSIS: M54.50 (ICD-10-CM) - Low back pain, unspecified  THERAPY DIAG: Other low back pain  Muscle weakness (generalized)  RATIONALE FOR EVALUATION AND TREATMENT: Rehabilitation  ONSET DATE: 06/29/2022  FOLLOW UP APPT WITH PROVIDER: Yes , pt follows up with MD in 3-4 weeks   SUBJECTIVE:                                                                                                                                                                                         Chief Complaint: Pt is a 56 year old male with history of chronic low back pain with Hx of relief with steroid injections.   Pertinent History Pt is a 56 year old male with history of chronic low back pain with Hx of relief with steroid injections. Patient reports episodic flare-ups of low back pain associated with pop during performance of activities requiring leaning forward or forward and sideways (i.e. flexion + sidebend). Pt reports a matter of hours or a day where it is hard to perform sit to stand or move about his home when  flare-up occurs. Pt reports Rx given by referring physician did seem to help. Patient reports his back feels "weak" presently. Patient reports intermittent tingling in his hands, but none in his lower extremities; he reports Hx of some sensory change in L thigh before, but that is not the case this time. Pt has history of major depression that is managed medically and is under control at this time per patient.   Pain:  Pain Intensity: Present: 0/10, Best: 0/10, Worst: 10/10  Pain location: Axial low back, L  paraspinal Pain Quality: sharp  Radiating: No  Numbness/Tingling: No Focal Weakness: Yes, some weakness in his back  Aggravating factors: transfer in/out of bed during transfer, leaning forward, leaning sideways, sitting Relieving factors: lying flat on back c legs bent  24-hour pain behavior: None History of prior back injury, pain, surgery, or therapy: Yes, history of prior lumbar spine injections  Falls: Has patient fallen in last 6 months? No Follow-up appointment with MD: Yes, pt follows up with MD in 3-4 weeks Imaging: Yes,  07/20/22 Lumbar spine radiograph Plain film radiograph of the lumbar spine demonstrates degenerative changes in the facet joints of the lower lumbar vertebrae, specifically L4-5 and L5-S1. Vertebral bodies with normal alignment. Intervertebral disc spaces grossly normal.   Prior level of function: Independent Occupational demands: Sale with parts for refuge, sometimes lifting heavy items  Hobbies: repairs around his home Red flags (bowel/bladder changes, saddle paresthesia, personal history of cancer, h/o spinal tumors, h/o compression fx, h/o abdominal aneurysm, abdominal pain, chills/fever, night sweats, nausea, vomiting, unrelenting pain, first onset of insidious LBP <20 y/o): Positive for:  -increased urgency to urinate  Precautions: None  Weight Bearing Restrictions: No  Living Environment Lives with: lives with their spouse and her son  Lives in:  House/apartment   Patient Goals: Pt able to perform appropriate exercises to minimize episodic flare-ups of back pain     OBJECTIVE:   Patient Surveys  FOTO 67, predicted outcome score of 58   Cognition Patient is oriented to person, place, and time.  Recent memory is intact.  Remote memory is intact.  Attention span and concentration are intact.  Expressive speech is intact.  Patient's fund of knowledge is within normal limits for educational level.     Gross Musculoskeletal Assessment Tremor: None Bulk: Normal Tone: Normal No visible step-off along spinal column, no signs of scoliosis   GAIT: Wide BOS, overpronation of feet bilat   Posture: Lumbar lordosis: WNL Iliac crest height: Equal bilaterally Lumbar lateral shift: Negative Moderate self-selected kyphotic posture in sitting, fair posture in standing    AROM  AROM (Normal range in degrees) AROM  08/02/2022  Lumbar   Flexion (65) 80% (hamstring stretch)   Extension (30) 100%   Right lateral flexion (25) 100% (slight discomfort R flank)  Left lateral flexion (25) 100% (R flank discomfort)  Right rotation (30) 100%  Left rotation (30) 100%      Hip Right Left  Flexion (125) WNL WNL  Extension (15)    Abduction (40) WNL WNL  Adduction     Internal Rotation (45)    External Rotation (45) WNL WNL      Knee    Flexion (135)    Extension (0)        Ankle    Dorsiflexion (20)    Plantarflexion (50)    Inversion (35)    Eversion (15    (* = pain; Blank rows = not tested)   LE MMT:  MMT (out of 5) Right 08/02/2022 Left 08/02/2022  Hip flexion 4 4  Hip extension 4 4-  Hip abduction 4 4+  Hip adduction 5 5  Hip internal rotation    Hip external rotation    Knee flexion 5 5  Knee extension 5 5  Ankle dorsiflexion 5 5  Ankle plantarflexion    Ankle inversion    Ankle eversion    (* = pain; Blank rows = not tested)   Sensation Deferred   Reflexes Deferred   Muscle  Length Hamstrings: R: Positive, lacking 20 deg L: Positive, lacking 15 deg Ely (quadriceps): R: Positive L: Positive Thomas (hip flexors): R: Not examined L: Not examined   Palpation  Location LEFT  RIGHT           Lumbar paraspinals 0 1  Quadratus Lumborum 0 0  Gluteus Maximus 0 0  Gluteus Medius 0 0  Deep hip external rotators 0 0  PSIS 0 0  Fortin's Area (SIJ)    Greater Trochanter    (Blank rows = not tested) Graded on 0-4 scale (0 = no pain, 1 = pain, 2 = pain with wincing/grimacing/flinching, 3 = pain with withdrawal, 4 = unwilling to allow palpation), (Blank rows = not tested)   Passive Accessory Intervertebral Motion Pt denies reproduction of back pain with CPA L1-L5 and UPA bilaterally L1-L5. Generally, hypomobile throughout L3-L5 and T7-T12    SPECIAL TESTS Lumbar Radiculopathy and Discogenic: Centralization and Peripheralization (SN 92, -LR 0.12): Not examined Slump (SN 83, -LR 0.32): R: Negative L: Negative SLR (SN 92, -LR 0.29): R: Negative L:  Negative Crossed SLR (SP 90): R: Negative L: Negative  Facet Joint: Extension-Rotation (SN 100, -LR 0.0): R: Negative L: Negative  Lumbar Foraminal Stenosis: Lumbar quadrant (SN 70): R: Negative L: Negative  SIJ:  Thigh Thrust (SN 88, -LR 0.18) : R: Not examined L: Not examined SIJ posterior primary stress: Negative Sacral compression: Negative     TODAY'S TREATMENT    Therapeutic Exercise - for HEP establishment, discussion on appropriate exercise/activity modification, PT education   Reviewed baseline home exercises and provided handout for Cash program (see Access Code); tactile cueing and therapist demonstration utilized as needed for carryover of proper technique to HEP.  Discussed utilizing walking program for improved long-term pain and function and for working toward weight loss goals.   Patient education on current condition, anatomy involved, prognosis, plan of care.       PATIENT  EDUCATION:  Education details: see above for patient education details Person educated: Patient Education method: Explanation, Demonstration, and Handouts Education comprehension: verbalized understanding and returned demonstration   HOME EXERCISE PROGRAM: Access Code: [email or web address] URL: [email or web address] Date: 08/03/2022 Prepared by: Valentina Gu  Exercises - Seated Hamstring Stretch  - 2 x daily - 7 x weekly - 3 sets - 30sec hold - Sidelying Thoracic Rotation with Open Book  - 1 x daily - 7 x weekly - 1 sets - 10 reps - 1-2sec hold - Supine Quadratus Lumborum Stretch  - 1 x daily - 7 x weekly - 2 sets - 10 reps - 1-2sec hold   ASSESSMENT:  CLINICAL IMPRESSION: Patient is a 56 y.o. male who was seen today for physical therapy evaluation and treatment for back pain. Patient has utilized meloxicam and methocarbamol as directed by his physician - he reports symptoms are under control following use of these prescriptions and pt denies significant symptoms during exam today. Pt has localized axial and paraspinal pain without signs of classic radiculopathy. Degenerative changes of facet joints are noted on imaging at L4-5 and L5-S1 levels. Objective impairments include decreased ROM, decreased strength, hypomobility, impaired flexibility, and pain. These impairments are limiting patient from community activity, occupation, and transferring in/out of bed and bending over intermittently . Personal factors including Past/current experiences and 3+ comorbidities: )COPD, major depression, obesity)  are also affecting patient's functional outcome. Patient will benefit from skilled PT to address above impairments and improve overall function.  REHAB POTENTIAL: Good  CLINICAL DECISION MAKING: Evolving/moderate complexity  EVALUATION COMPLEXITY: Moderate   GOALS:  SHORT TERM GOALS: Target date: 08/17/22  Pt will be independent with HEP to improve strength and decrease back pain to  improve pain-free function at home and work. Baseline: 08/01/22: Baseline HEP initiated and discussed regular walking program.  Goal status: INITIAL   LONG TERM GOALS: Target date: 09/12/2022  Pt will increase FOTO to at least 76 to demonstrate significant improvement in function at home and work related to back pain  Baseline: 08/01/22: 67 Goal status: INITIAL  2.  Pt will decrease worst back pain to no greater than 2-3/10 on the NPRS in order to demonstrate clinically significant reduction in back pain. Baseline: 08/01/22: 10/10 at worst Goal status: INITIAL  3.   Pt will access full thoracolumbar AROM without reproduction of pain as needed for bending to retrieve low-lying item, reaching, bed mobility, and self-care ADLs e.g. dressing Baseline: 08/01/22: Motion loss with flexion, mild pain with bilateral lateral flexion Goal status: INITIAL  4.  Patient will have full week with no pain performing supine to sit and sit to stand from edge of bed as needed for home-level mobility Baseline: 08/01/22: Significant pain with getting up from bed during flare-up period Goal status: INITIAL   PLAN: PT FREQUENCY: 1-2x/week  PT DURATION: 4-6 weeks  PLANNED INTERVENTIONS: Therapeutic exercises, Therapeutic activity, Neuromuscular re-education, Patient/Family education, Joint manipulation, Joint mobilization, Dry Needling, Electrical stimulation, Spinal manipulation, Spinal mobilization, Cryotherapy, Moist heat, Traction,  and Manual therapy  PLAN FOR NEXT SESSION: Thoracic mobility, posterior LE chain flexibility/extensibility, hip and posterior chain strengthening strengthening; mid to lower lumbar spine mobilization for lumbar spine stiffness, progress HEP with anticipated transition to HEP over 3-4 weeks pending no regression in patient's condition. Encourage gradual increase in physical activity with regular walking regimen.     Valentina Gu, PT, DPT [id number]  Eilleen Kempf 08/02/2022, 1:16 PM

## 2022-08-03 ENCOUNTER — Ambulatory Visit: Payer: BC Managed Care – PPO | Admitting: Physical Therapy

## 2022-08-03 ENCOUNTER — Encounter: Payer: Self-pay | Admitting: Physical Therapy

## 2022-08-03 DIAGNOSIS — M6281 Muscle weakness (generalized): Secondary | ICD-10-CM | POA: Diagnosis not present

## 2022-08-03 DIAGNOSIS — M5459 Other low back pain: Secondary | ICD-10-CM

## 2022-08-03 NOTE — Therapy (Signed)
OUTPATIENT PHYSICAL THERAPY TREATMENT NOTE   Patient Name: Stanley Villarreal MRN: [id number] DOB:[birth year], 56 y.o., male Today's Date: 08/03/2022    END OF SESSION:   PT End of Session - 08/03/22 1343     Visit Number 2    Number of Visits 8    Date for PT Re-Evaluation 08/31/22    Authorization Type [employer]; visit limit 30 per year    Progress Note Due on Visit 8    PT Start Time 1345    PT Stop Time 1430    PT Time Calculation (min) 45 min    Activity Tolerance No increased pain;Patient tolerated treatment well    Behavior During Therapy [facility] for tasks assessed/performed             Past Medical History:  Diagnosis Date   COPD (chronic obstructive pulmonary disease) (HCC)    Depression    History reviewed. No pertinent surgical history. There are no problems to display for this patient.    PCP: No primary care provider on file.   REFERRING PROVIDER: Windle Guard, MD   REFERRING DIAGNOSIS: M54.50 (ICD-10-CM) - Low back pain, unspecified   THERAPY DIAG: Other low back pain   Muscle weakness (generalized)   RATIONALE FOR EVALUATION AND TREATMENT: Rehabilitation   ONSET DATE: 06/29/2022   PERTINENT HISTORY: Pt is a 56 year old male with history of chronic low back pain with Hx of relief with steroid injections. Patient reports episodic flare-ups of low back pain associated with pop during performance of activities requiring leaning forward or forward and sideways (i.e. flexion + sidebend). Pt reports a matter of hours or a day where it is hard to perform sit to stand or move about his home when flare-up occurs. Pt reports Rx given by referring physician did seem to help. Patient reports his back feels "weak" presently. Patient reports intermittent tingling in his hands, but none in his lower extremities; he reports Hx of some sensory change in L thigh before, but that is not the case this time. Pt has history of major depression that is managed medically and is under  control at this time per patient.    Pain:  Pain Intensity: Present: 0/10, Best: 0/10, Worst: 10/10  Pain location: Axial low back, L paraspinal Pain Quality: sharp  Radiating: No  Numbness/Tingling: No Focal Weakness: Yes, some weakness in his back  Aggravating factors: transfer in/out of bed during transfer, leaning forward, leaning sideways, sitting Relieving factors: lying flat on back c legs bent  24-hour pain behavior: None History of prior back injury, pain, surgery, or therapy: Yes, history of prior lumbar spine injections  Falls: Has patient fallen in last 6 months? No Follow-up appointment with MD: Yes, pt follows up with MD in 3-4 weeks Imaging: Yes,   07/20/22 Lumbar spine radiograph Plain film radiograph of the lumbar spine demonstrates degenerative changes in the facet joints of the lower lumbar vertebrae, specifically L4-5 and L5-S1. Vertebral bodies with normal alignment. Intervertebral disc spaces grossly normal.    Prior level of function: Independent Occupational demands: Sale with parts for refuge, sometimes lifting heavy items  Hobbies: repairs around his home Red flags (bowel/bladder changes, saddle paresthesia, personal history of cancer, h/o spinal tumors, h/o compression fx, h/o abdominal aneurysm, abdominal pain, chills/fever, night sweats, nausea, vomiting, unrelenting pain, first onset of insidious LBP <20 y/o): Positive for:             -increased urgency to urinate   Precautions: None  Weight Bearing Restrictions: No   Living Environment Lives with: lives with their spouse and her son  Lives in: House/apartment     Patient Goals: Pt able to perform appropriate exercises to minimize episodic flare-ups of back pain       SUBJECTIVE: Patient denies pain at arrival. Patient reports doing well with his initial HEP.   PAIN:  Are you having pain? No   OBJECTIVE: (objective measures completed at initial evaluation unless otherwise dated)     Patient Surveys  FOTO 67, predicted outcome score of 42     Cognition Patient is oriented to person, place, and time.  Recent memory is intact.  Remote memory is intact.  Attention span and concentration are intact.  Expressive speech is intact.  Patient's fund of knowledge is within normal limits for educational level.                            Gross Musculoskeletal Assessment Tremor: None Bulk: Normal Tone: Normal No visible step-off along spinal column, no signs of scoliosis     GAIT: Wide BOS, overpronation of feet bilat     Posture: Lumbar lordosis: WNL Iliac crest height: Equal bilaterally Lumbar lateral shift: Negative Moderate self-selected kyphotic posture in sitting, fair posture in standing      AROM       AROM (Normal range in degrees) AROM  08/02/2022  Lumbar    Flexion (65) 80% (hamstring stretch)   Extension (30) 100%   Right lateral flexion (25) 100% (slight discomfort R flank)  Left lateral flexion (25) 100% (R flank discomfort)  Right rotation (30) 100%  Left rotation (30) 100%         Hip Right Left  Flexion (125) WNL WNL  Extension (15)      Abduction (40) WNL WNL  Adduction       Internal Rotation (45)      External Rotation (45) WNL WNL         Knee      Flexion (135)      Extension (0)             Ankle      Dorsiflexion (20)      Plantarflexion (50)      Inversion (35)      Eversion (15      (* = pain; Blank rows = not tested)     LE MMT:   MMT (out of 5) Right 08/02/2022 Left 08/02/2022  Hip flexion 4 4  Hip extension 4 4-  Hip abduction 4 4+  Hip adduction 5 5  Hip internal rotation      Hip external rotation      Knee flexion 5 5  Knee extension 5 5  Ankle dorsiflexion 5 5  Ankle plantarflexion      Ankle inversion      Ankle eversion      (* = pain; Blank rows = not tested)     Sensation Deferred     Reflexes Deferred     Muscle Length Hamstrings: R: Positive, lacking 20 deg L: Positive, lacking  15 deg Ely (quadriceps): R: Positive L: Positive Thomas (hip flexors): R: WNL, L: WNL     Palpation   Location LEFT  RIGHT           Lumbar paraspinals 0 1  Quadratus Lumborum 0 0  Gluteus Maximus 0 0  Gluteus Medius 0 0  Deep hip  external rotators 0 0  PSIS 0 0  Fortin's Area (SIJ)      Greater Trochanter      (Blank rows = not tested) Graded on 0-4 scale (0 = no pain, 1 = pain, 2 = pain with wincing/grimacing/flinching, 3 = pain with withdrawal, 4 = unwilling to allow palpation), (Blank rows = not tested)     Passive Accessory Intervertebral Motion Pt denies reproduction of back pain with CPA L1-L5 and UPA bilaterally L1-L5. Generally, hypomobile throughout L3-L5 and T7-T12     SPECIAL TESTS Lumbar Radiculopathy and Discogenic: Centralization and Peripheralization (SN 92, -LR 0.12): Not examined Slump (SN 83, -LR 0.32): R: Negative L: Negative SLR (SN 92, -LR 0.29): R: Negative L:  Negative Crossed SLR (SP 90): R: Negative L: Negative   Facet Joint: Extension-Rotation (SN 100, -LR 0.0): R: Negative L: Negative   Lumbar Foraminal Stenosis: Lumbar quadrant (SN 70): R: Negative L: Negative   SIJ:  Thigh Thrust (SN 88, -LR 0.18) : R: Not examined L: Not examined SIJ posterior primary stress: Negative Sacral compression: Negative        TODAY'S TREATMENT     Manual Therapy - for symptom modulation, soft tissue sensitivity and mobility, joint mobility, ROM   CPA T3-T10, gr III for improved mobility CPA L3-5 gr III-IV for improved mobility      Therapeutic Exercise - for improved soft tissue flexibility and extensibility as needed for ROM, improved strength as needed to improve performance of CKC activities/functional movements  For HEP review:  Lower trunk rotation with QL bias; x10 with each LE Open book, sidelying; 1x10 on each side Bridge; 2x10        PATIENT EDUCATION:  Education details: see above for patient education details Person educated:  Patient Education method: Explanation, Demonstration, and Handouts Education comprehension: verbalized understanding and returned demonstration     HOME EXERCISE PROGRAM: Access Code: [email or web address] URL: [email or web address] Date: 08/03/2022 Prepared by: Consuela Mimes   Exercises - Seated Hamstring Stretch  - 2 x daily - 7 x weekly - 3 sets - 30sec hold - Sidelying Thoracic Rotation with Open Book  - 1 x daily - 7 x weekly - 1 sets - 10 reps - 1-2sec hold - Supine Quadratus Lumborum Stretch  - 1 x daily - 7 x weekly - 2 sets - 10 reps - 1-2sec hold     ASSESSMENT:   CLINICAL IMPRESSION: Patient is a 56 y.o. male who was seen today for physical therapy evaluation and treatment for back pain. Patient has utilized meloxicam and methocarbamol as directed by his physician - he reports symptoms are under control following use of these prescriptions and pt denies significant symptoms during exam today. Pt has localized axial and paraspinal pain without signs of classic radiculopathy. Degenerative changes of facet joints are noted on imaging at L4-5 and L5-S1 levels. Objective impairments include decreased ROM, decreased strength, hypomobility, impaired flexibility, and pain. These impairments are limiting patient from community activity, occupation, and transferring in/out of bed and bending over intermittently . Personal factors including Past/current experiences and 3+ comorbidities: )COPD, major depression, obesity)  are also affecting patient's functional outcome. Patient will benefit from skilled PT to address above impairments and improve overall function.   REHAB POTENTIAL: Good   CLINICAL DECISION MAKING: Evolving/moderate complexity   EVALUATION COMPLEXITY: Moderate     GOALS:   SHORT TERM GOALS: Target date: 08/17/22   Pt will be independent with HEP to improve strength and decrease back pain to  improve pain-free function at home and work. Baseline: 08/01/22: Baseline  HEP initiated and discussed regular walking program.  Goal status: INITIAL     LONG TERM GOALS: Target date: 09/12/2022   Pt will increase FOTO to at least 76 to demonstrate significant improvement in function at home and work related to back pain  Baseline: 08/01/22: 67 Goal status: INITIAL   2.  Pt will decrease worst back pain to no greater than 2-3/10 on the NPRS in order to demonstrate clinically significant reduction in back pain. Baseline: 08/01/22: 10/10 at worst Goal status: INITIAL   3.   Pt will access full thoracolumbar AROM without reproduction of pain as needed for bending to retrieve low-lying item, reaching, bed mobility, and self-care ADLs e.g. dressing Baseline: 08/01/22: Motion loss with flexion, mild pain with bilateral lateral flexion Goal status: INITIAL   4.  Patient will have full week with no pain performing supine to sit and sit to stand from edge of bed as needed for home-level mobility Baseline: 08/01/22: Significant pain with getting up from bed during flare-up period Goal status: INITIAL     PLAN: PT FREQUENCY: 1-2x/week   PT DURATION: 4-6 weeks   PLANNED INTERVENTIONS: Therapeutic exercises, Therapeutic activity, Neuromuscular re-education, Patient/Family education, Joint manipulation, Joint mobilization, Dry Needling, Electrical stimulation, Spinal manipulation, Spinal mobilization, Cryotherapy, Moist heat, Traction,  and Manual therapy   PLAN FOR NEXT SESSION: Thoracic mobility, posterior LE chain flexibility/extensibility, hip and posterior chain strengthening; mid to lower lumbar spine mobilization for lumbar spine stiffness, progress HEP with anticipated transition to HEP over 3-4 weeks pending no regression in patient's condition. Encourage gradual increase in physical activity with regular walking regimen.    Consuela Mimes, PT, DPT #[id number]  Gertie Exon, PT 08/03/2022, 2:29 PM

## 2022-08-08 ENCOUNTER — Ambulatory Visit: Payer: BC Managed Care – PPO | Admitting: Physical Therapy

## 2022-08-10 ENCOUNTER — Ambulatory Visit: Payer: BC Managed Care – PPO | Admitting: Physical Therapy

## 2022-08-10 ENCOUNTER — Encounter: Payer: Self-pay | Admitting: Physical Therapy

## 2022-08-10 DIAGNOSIS — M6281 Muscle weakness (generalized): Secondary | ICD-10-CM

## 2022-08-10 DIAGNOSIS — M5459 Other low back pain: Secondary | ICD-10-CM

## 2022-08-10 NOTE — Therapy (Signed)
OUTPATIENT PHYSICAL THERAPY TREATMENT NOTE   Patient Name: Stanley Villarreal MRN: [id number] DOB:26-Sep-1966, 56 y.o., male Today's Date: 08/10/2022    END OF SESSION:   PT End of Session - 08/10/22 1425     Visit Number 3    Number of Visits 8    Date for PT Re-Evaluation 08/31/22    Authorization Type [employer]; visit limit 30 per year    Progress Note Due on Visit 8    PT Start Time 1427    PT Stop Time 1510    PT Time Calculation (min) 43 min    Activity Tolerance No increased pain;Patient tolerated treatment well    Behavior During Therapy [facility] for tasks assessed/performed             Past Medical History:  Diagnosis Date   COPD (chronic obstructive pulmonary disease) ([place])    Depression    No past surgical history on file. There are no problems to display for this patient.    PCP: No primary care provider on file.   REFERRING PROVIDER: Bebe Shaggy, MD   REFERRING DIAGNOSIS: M54.50 (ICD-10-CM) - Low back pain, unspecified   THERAPY DIAG: Other low back pain   Muscle weakness (generalized)   RATIONALE FOR EVALUATION AND TREATMENT: Rehabilitation   ONSET DATE: 06/29/2022   PERTINENT HISTORY: Pt is a 56 year old male with history of chronic low back pain with Hx of relief with steroid injections. Patient reports episodic flare-ups of low back pain associated with pop during performance of activities requiring leaning forward or forward and sideways (i.e. flexion + sidebend). Pt reports a matter of hours or a day where it is hard to perform sit to stand or move about his home when flare-up occurs. Pt reports Rx given by referring physician did seem to help. Patient reports his back feels "weak" presently. Patient reports intermittent tingling in his hands, but none in his lower extremities; he reports Hx of some sensory change in L thigh before, but that is not the case this time. Pt has history of major depression that is managed medically and is under control at this  time per patient.    Pain:  Pain Intensity: Present: 0/10, Best: 0/10, Worst: 10/10  Pain location: Axial low back, L paraspinal Pain Quality: sharp  Radiating: No  Numbness/Tingling: No Focal Weakness: Yes, some weakness in his back  Aggravating factors: transfer in/out of bed during transfer, leaning forward, leaning sideways, sitting Relieving factors: lying flat on back c legs bent  24-hour pain behavior: None History of prior back injury, pain, surgery, or therapy: Yes, history of prior lumbar spine injections  Falls: Has patient fallen in last 6 months? No Follow-up appointment with MD: Yes, pt follows up with MD in 3-4 weeks Imaging: Yes,   07/20/22 Lumbar spine radiograph Plain film radiograph of the lumbar spine demonstrates degenerative changes in the facet joints of the lower lumbar vertebrae, specifically L4-5 and L5-S1. Vertebral bodies with normal alignment. Intervertebral disc spaces grossly normal.    Prior level of function: Independent Occupational demands: Sale with parts for refuge, sometimes lifting heavy items  Hobbies: repairs around his home Red flags (bowel/bladder changes, saddle paresthesia, personal history of cancer, h/o spinal tumors, h/o compression fx, h/o abdominal aneurysm, abdominal pain, chills/fever, night sweats, nausea, vomiting, unrelenting pain, first onset of insidious LBP <20 y/o): Positive for:             -increased urgency to urinate   Precautions: None  Weight Bearing Restrictions: No   Living Environment Lives with: lives with their spouse and her son  Lives in: House/apartment     Patient Goals: Pt able to perform appropriate exercises to minimize episodic flare-ups of back pain        OBJECTIVE: (objective measures completed at initial evaluation unless otherwise dated)    Patient Surveys  FOTO 67, predicted outcome score of 63     Cognition Patient is oriented to person, place, and time.  Recent memory is intact.   Remote memory is intact.  Attention span and concentration are intact.  Expressive speech is intact.  Patient's fund of knowledge is within normal limits for educational level.                            Gross Musculoskeletal Assessment Tremor: None Bulk: Normal Tone: Normal No visible step-off along spinal column, no signs of scoliosis     GAIT: Wide BOS, overpronation of feet bilat     Posture: Lumbar lordosis: WNL Iliac crest height: Equal bilaterally Lumbar lateral shift: Negative Moderate self-selected kyphotic posture in sitting, fair posture in standing      AROM       AROM (Normal range in degrees) AROM  08/02/2022  Lumbar    Flexion (65) 80% (hamstring stretch)   Extension (30) 100%   Right lateral flexion (25) 100% (slight discomfort R flank)  Left lateral flexion (25) 100% (R flank discomfort)  Right rotation (30) 100%  Left rotation (30) 100%         Hip Right Left  Flexion (125) WNL WNL  Extension (15)      Abduction (40) WNL WNL  Adduction       Internal Rotation (45)      External Rotation (45) WNL WNL         Knee      Flexion (135)      Extension (0)             Ankle      Dorsiflexion (20)      Plantarflexion (50)      Inversion (35)      Eversion (15      (* = pain; Blank rows = not tested)     LE MMT:   MMT (out of 5) Right 08/02/2022 Left 08/02/2022  Hip flexion 4 4  Hip extension 4 4-  Hip abduction 4 4+  Hip adduction 5 5  Hip internal rotation      Hip external rotation      Knee flexion 5 5  Knee extension 5 5  Ankle dorsiflexion 5 5  Ankle plantarflexion      Ankle inversion      Ankle eversion      (* = pain; Blank rows = not tested)     Sensation Deferred     Reflexes Deferred     Muscle Length (updated on 08/03/22) Hamstrings: R: Positive, lacking 20 deg L: Positive, lacking 15 deg E[place] (quadriceps): R: Positive L: Positive Thomas (hip flexors): R: WNL, L: WNL     Palpation   Location LEFT  RIGHT            Lumbar paraspinals 0 1  Quadratus Lumborum 0 0  Gluteus Maximus 0 0  Gluteus Medius 0 0  Deep hip external rotators 0 0  PSIS 0 0  Fortin's Area (SIJ)      Greater Trochanter      (  Blank rows = not tested) Graded on 0-4 scale (0 = no pain, 1 = pain, 2 = pain with wincing/grimacing/flinching, 3 = pain with withdrawal, 4 = unwilling to allow palpation), (Blank rows = not tested)     Passive Accessory Intervertebral Motion Pt denies reproduction of back pain with CPA L1-L5 and UPA bilaterally L1-L5. Generally, hypomobile throughout L3-L5 and T7-T12     SPECIAL TESTS Lumbar Radiculopathy and Discogenic: Centralization and Peripheralization (SN 92, -LR 0.12): Not examined Slump (SN 83, -LR 0.32): R: Negative L: Negative SLR (SN 92, -LR 0.29): R: Negative L:  Negative Crossed SLR (SP 90): R: Negative L: Negative   Facet Joint: Extension-Rotation (SN 100, -LR 0.0): R: Negative L: Negative   Lumbar Foraminal Stenosis: Lumbar quadrant (SN 70): R: Negative L: Negative   SIJ:  Thigh Thrust (SN 88, -LR 0.18) : R: Not examined L: Not examined SIJ posterior primary stress: Negative Sacral compression: Negative        TODAY'S TREATMENT     SUBJECTIVE: Patient denies significant symptoms at arrival to PT. Patient reports limited activity today due to busy workday. Patient reports doing well with exercises. He states that he and his wife are planning to start attending local gym and increasing regular physical activity.   PAIN:  Are you having pain? No    Manual Therapy - for symptom modulation, soft tissue sensitivity and mobility, joint mobility, ROM   CPA T3-T10, gr III for improved mobility CPA L3-5 gr II for pain control - mild reproduction of pain with L4-5 CPA  No tenderness to palpation along lumbar paraspinals or gluteal musculature today, only TTP along L4-S1 spinous processes     Therapeutic Exercise - for improved soft tissue flexibility and extensibility  as needed for ROM, improved strength as needed to improve performance of CKC activities/functional movements  Cat Camel; x10 ea dir Lower trunk rotations; x10 ea dir Bird dog; 1x8 alternating - heavy cueing for technique and maintenance of neutral spine Lower trunk rotation with QL bias; x10 with each LE - reviewed Open book, sidelying; 1x10 on each side Bridge; reviewed Sidelying Hip abduction; 2x10 on each side  Dying bug; supine; 2x10 alternating  PATIENT EDUCATION: HEP update. Discussed POC in context of minimal pain, mobility, or functional activity limitations with expectation for tapered visits and quick transition to independent exercise.     *not today* Alternating hip extension in prone; 2x10, alt with demonstration and verbal cueing for technique     PATIENT EDUCATION:  Education details: see above for patient education details. Demonstration and verbal/tactile cueing for correct exercise technique.  Person educated: Patient Education method: Explanation, Demonstration, and Handouts Education comprehension: verbalized understanding and returned demonstration     HOME EXERCISE PROGRAM: Access Code: [email or web address] URL: [email or web address] Date: 08/03/2022 Prepared by: Consuela Mimes   Exercises Access Code: [email or web address] URL: [email or web address] Date: 08/06/2022 Prepared by: Consuela Mimes  Exercises - Seated Hamstring Stretch  - 2 x daily - 7 x weekly - 3 sets - 30sec hold - Sidelying Thoracic Rotation with Open Book  - 1 x daily - 7 x weekly - 1 sets - 10 reps - 1-2sec hold - Supine Quadratus Lumborum Stretch  - 1 x daily - 7 x weekly - 2 sets - 10 reps - 1-2sec hold - Supine Bridge  - 1 x daily - 4 x weekly - 2 sets - 10 reps - Prone Hip Extension  - 1 x daily - 4 x weekly - 2 sets - 10  reps     ASSESSMENT:   CLINICAL IMPRESSION:  Patient has minimal symptoms at this time and has completed taking his anti-inflammatory medication. He  demonstrates normal thoracolumbar AROM without reproduction of symptoms. Pt is challenged by some hip and trunk stabilization exercise due to deconditioning and weight status. Pt is fortunately motivated to engage in walking program and plans to start going to local gym for regular exercise with his spouse. Pt has responded well with medical management and has limited need for longer-term formal PT intervention - pt will be able to transition to IND HEP and self-management soon. Patient has remaining deficits in thoracolumbar AROM, thoracic and lower lumbar spine accessory mobility, flexibility deficits in quads/HS, hip strength, and intermittent axial low back pain without LE referral pattern or paresthesias. Patient will benefit from continued skilled therapeutic intervention to address the above deficits as needed for improved function and QoL with focus on transition to independent program over next 2-3 weeks given no significant symptoms at this time (pending no flare-up or regression in patient's condition).     REHAB POTENTIAL: Good   CLINICAL DECISION MAKING: Evolving/moderate complexity   EVALUATION COMPLEXITY: Moderate     GOALS:   SHORT TERM GOALS: Target date: 08/17/22   Pt will be independent with HEP to improve strength and decrease back pain to improve pain-free function at home and work. Baseline: 08/01/22: Baseline HEP initiated and discussed regular walking program.  Goal status: INITIAL     LONG TERM GOALS: Target date: 09/12/2022   Pt will increase FOTO to at least 76 to demonstrate significant improvement in function at home and work related to back pain  Baseline: 08/01/22: 67 Goal status: INITIAL   2.  Pt will decrease worst back pain to no greater than 2-3/10 on the NPRS in order to demonstrate clinically significant reduction in back pain. Baseline: 08/01/22: 10/10 at worst Goal status: INITIAL   3.   Pt will access full thoracolumbar AROM without reproduction of  pain as needed for bending to retrieve low-lying item, reaching, bed mobility, and self-care ADLs e.g. dressing Baseline: 08/01/22: Motion loss with flexion, mild pain with bilateral lateral flexion Goal status: INITIAL   4.  Patient will have full week with no pain performing supine to sit and sit to stand from edge of bed as needed for home-level mobility Baseline: 08/01/22: Significant pain with getting up from bed during flare-up period Goal status: INITIAL     PLAN: PT FREQUENCY: 1-2x/week   PT DURATION: 4-6 weeks   PLANNED INTERVENTIONS: Therapeutic exercises, Therapeutic activity, Neuromuscular re-education, Patient/Family education, Joint manipulation, Joint mobilization, Dry Needling, Electrical stimulation, Spinal manipulation, Spinal mobilization, Cryotherapy, Moist heat, Traction,  and Manual therapy   PLAN FOR NEXT SESSION: Thoracic mobility, posterior LE chain flexibility/extensibility, hip and posterior chain strengthening; mid to lower lumbar spine mobilization for lumbar spine stiffness, progress HEP with anticipated transition to HEP over 3-4 weeks pending no regression in patient's condition. Encourage gradual increase in physical activity with regular walking regimen.    Consuela Mimes, PT, DPT #[id number]  Gertie Exon, PT 08/10/2022, 2:25 PM

## 2022-08-15 ENCOUNTER — Ambulatory Visit: Payer: BC Managed Care – PPO

## 2022-08-15 DIAGNOSIS — M5459 Other low back pain: Secondary | ICD-10-CM

## 2022-08-15 DIAGNOSIS — M6281 Muscle weakness (generalized): Secondary | ICD-10-CM | POA: Diagnosis not present

## 2022-08-15 NOTE — Therapy (Signed)
OUTPATIENT PHYSICAL THERAPY TREATMENT NOTE   Patient Name: Stanley Villarreal MRN: [id number] DOB:24-Feb-1966, 56 y.o., male Today's Date: 08/15/2022    END OF SESSION:   PT End of Session - 08/15/22 1704     Visit Number 4    Number of Visits 8    Date for PT Re-Evaluation 08/31/22    Authorization Type [employer]; visit limit 30 per year    Progress Note Due on Visit 8    PT Start Time [id number]    PT Stop Time 1600    PT Time Calculation (min) 45 min    Activity Tolerance No increased pain;Patient tolerated treatment well    Behavior During Therapy [facility] for tasks assessed/performed             Past Medical History:  Diagnosis Date   COPD (chronic obstructive pulmonary disease) ([place])    Depression    No past surgical history on file. There are no problems to display for this patient.    PCP: No primary care provider on file.   REFERRING PROVIDER: Bebe Shaggy, MD   REFERRING DIAGNOSIS: M54.50 (ICD-10-CM) - Low back pain, unspecified   THERAPY DIAG: Other low back pain   Muscle weakness (generalized)   RATIONALE FOR EVALUATION AND TREATMENT: Rehabilitation   ONSET DATE: 06/29/2022   PERTINENT HISTORY: Pt is a 56 year old male with history of chronic low back pain with Hx of relief with steroid injections. Patient reports episodic flare-ups of low back pain associated with pop during performance of activities requiring leaning forward or forward and sideways (i.e. flexion + sidebend). Pt reports a matter of hours or a day where it is hard to perform sit to stand or move about his home when flare-up occurs. Pt reports Rx given by referring physician did seem to help. Patient reports his back feels "weak" presently. Patient reports intermittent tingling in his hands, but none in his lower extremities; he reports Hx of some sensory change in L thigh before, but that is not the case this time. Pt has history of major depression that is managed medically and is under control at this  time per patient.    Pain:  Pain Intensity: Present: 0/10, Best: 0/10, Worst: 10/10  Pain location: Axial low back, L paraspinal Pain Quality: sharp  Radiating: No  Numbness/Tingling: No Focal Weakness: Yes, some weakness in his back  Aggravating factors: transfer in/out of bed during transfer, leaning forward, leaning sideways, sitting Relieving factors: lying flat on back c legs bent  24-hour pain behavior: None History of prior back injury, pain, surgery, or therapy: Yes, history of prior lumbar spine injections  Falls: Has patient fallen in last 6 months? No Follow-up appointment with MD: Yes, pt follows up with MD in 3-4 weeks Imaging: Yes,   07/20/22 Lumbar spine radiograph Plain film radiograph of the lumbar spine demonstrates degenerative changes in the facet joints of the lower lumbar vertebrae, specifically L4-5 and L5-S1. Vertebral bodies with normal alignment. Intervertebral disc spaces grossly normal.    Prior level of function: Independent Occupational demands: Sale with parts for refuge, sometimes lifting heavy items  Hobbies: repairs around his home Red flags (bowel/bladder changes, saddle paresthesia, personal history of cancer, h/o spinal tumors, h/o compression fx, h/o abdominal aneurysm, abdominal pain, chills/fever, night sweats, nausea, vomiting, unrelenting pain, first onset of insidious LBP <20 y/o): Positive for:             -increased urgency to urinate   Precautions: None  Weight Bearing Restrictions: No   Living Environment Lives with: lives with their spouse and her son  Lives in: House/apartment     Patient Goals: Pt able to perform appropriate exercises to minimize episodic flare-ups of back pain        OBJECTIVE: (objective measures completed at initial evaluation unless otherwise dated)    Patient Surveys  FOTO 67, predicted outcome score of 63     Cognition Patient is oriented to person, place, and time.  Recent memory is intact.   Remote memory is intact.  Attention span and concentration are intact.  Expressive speech is intact.  Patient's fund of knowledge is within normal limits for educational level.                            Gross Musculoskeletal Assessment Tremor: None Bulk: Normal Tone: Normal No visible step-off along spinal column, no signs of scoliosis     GAIT: Wide BOS, overpronation of feet bilat     Posture: Lumbar lordosis: WNL Iliac crest height: Equal bilaterally Lumbar lateral shift: Negative Moderate self-selected kyphotic posture in sitting, fair posture in standing      AROM       AROM (Normal range in degrees) AROM  08/02/2022  Lumbar    Flexion (65) 80% (hamstring stretch)   Extension (30) 100%   Right lateral flexion (25) 100% (slight discomfort R flank)  Left lateral flexion (25) 100% (R flank discomfort)  Right rotation (30) 100%  Left rotation (30) 100%         Hip Right Left  Flexion (125) WNL WNL  Extension (15)      Abduction (40) WNL WNL  Adduction       Internal Rotation (45)      External Rotation (45) WNL WNL         Knee      Flexion (135)      Extension (0)             Ankle      Dorsiflexion (20)      Plantarflexion (50)      Inversion (35)      Eversion (15      (* = pain; Blank rows = not tested)     LE MMT:   MMT (out of 5) Right 08/02/2022 Left 08/02/2022  Hip flexion 4 4  Hip extension 4 4-  Hip abduction 4 4+  Hip adduction 5 5  Hip internal rotation      Hip external rotation      Knee flexion 5 5  Knee extension 5 5  Ankle dorsiflexion 5 5  Ankle plantarflexion      Ankle inversion      Ankle eversion      (* = pain; Blank rows = not tested)     Sensation Deferred     Reflexes Deferred     Muscle Length (updated on 08/03/22) Hamstrings: R: Positive, lacking 20 deg L: Positive, lacking 15 deg E[place] (quadriceps): R: Positive L: Positive Thomas (hip flexors): R: WNL, L: WNL     Palpation   Location LEFT  RIGHT            Lumbar paraspinals 0 1  Quadratus Lumborum 0 0  Gluteus Maximus 0 0  Gluteus Medius 0 0  Deep hip external rotators 0 0  PSIS 0 0  Fortin's Area (SIJ)      Greater Trochanter      (  Blank rows = not tested) Graded on 0-4 scale (0 = no pain, 1 = pain, 2 = pain with wincing/grimacing/flinching, 3 = pain with withdrawal, 4 = unwilling to allow palpation), (Blank rows = not tested)     Passive Accessory Intervertebral Motion Pt denies reproduction of back pain with CPA L1-L5 and UPA bilaterally L1-L5. Generally, hypomobile throughout L3-L5 and T7-T12     SPECIAL TESTS Lumbar Radiculopathy and Discogenic: Centralization and Peripheralization (SN 92, -LR 0.12): Not examined Slump (SN 83, -LR 0.32): R: Negative L: Negative SLR (SN 92, -LR 0.29): R: Negative L:  Negative Crossed SLR (SP 90): R: Negative L: Negative   Facet Joint: Extension-Rotation (SN 100, -LR 0.0): R: Negative L: Negative   Lumbar Foraminal Stenosis: Lumbar quadrant (SN 70): R: Negative L: Negative   SIJ:  Thigh Thrust (SN 88, -LR 0.18) : R: Not examined L: Not examined SIJ posterior primary stress: Negative Sacral compression: Negative        TODAY'S TREATMENT     SUBJECTIVE: Patient reports he is still very sore from doing the hip abd exercise at home.  He notes no lower back pain upon arrival.  He will be on vacation out of town next week.  PAIN:  Are you having pain? No    Manual Therapy - for symptom modulation, soft tissue sensitivity and mobility, joint mobility, ROM   CPA T3-T10, gr III for improved mobility CPA L3-5 gr II for pain control - mild reproduction of pain with L4-5 CPA  No tenderness to palpation along lumbar paraspinals or gluteal musculature today, only TTP along L4-S1 spinous processes     Therapeutic Exercise - for improved soft tissue flexibility and extensibility as needed for ROM, improved strength as needed to improve performance of CKC activities/functional  movements  Cat Camel; x10 ea dir Lower trunk rotations; x10 ea dir Bird dog; 1x8 alternating - heavy cueing for technique and maintenance of neutral spine Lower trunk rotation with QL bias; x10 with each LE - reviewed Open book, sidelying; 1x10 on each side Bridge; 5 second hold x15 Sidelying Hip abduction; 2x10 on each side- held off due to pt still having post exercise soreness since last week, instructed to resume tomorrow as part of his HEP Dying bug; supine; 2x10 alternating Supine abdominal bracing x 10 Supine abdominal bracing with alternating LE march 2x10  PATIENT EDUCATION: HEP update. Discussed POC in context of minimal pain, mobility, or functional activity limitations with expectation for tapered visits and quick transition to independent exercise.     *not today* Alternating hip extension in prone; 2x10, alt with demonstration and verbal cueing for technique     PATIENT EDUCATION:  Education details: see above for patient education details. Demonstration and verbal/tactile cueing for correct exercise technique.  Person educated: Patient Education method: Explanation, Demonstration, and Handouts Education comprehension: verbalized understanding and returned demonstration     HOME EXERCISE PROGRAM: Access Code: [email or web address] URL: [email or web address] Date: 08/03/2022 Prepared by: Valentina Gu   Exercises Access Code: [email or web address] URL: [email or web address] Date: 08/06/2022 Prepared by: Valentina Gu  Exercises - Seated Hamstring Stretch  - 2 x daily - 7 x weekly - 3 sets - 30sec hold - Sidelying Thoracic Rotation with Open Book  - 1 x daily - 7 x weekly - 1 sets - 10 reps - 1-2sec hold - Supine Quadratus Lumborum Stretch  - 1 x daily - 7 x weekly - 2 sets - 10 reps - 1-2sec hold - Supine Bridge  - 1 x  daily - 4 x weekly - 2 sets - 10 reps - Prone Hip Extension  - 1 x daily - 4 x weekly - 2 sets - 10 reps     ASSESSMENT:   CLINICAL  IMPRESSION:  Able to progress pt's lumbar spine muscular stabilization training program without c/o increased LBP.  He continues to have decreased lumbothoracic spine extension mobility; he tolerated manual therapy techniques for lumbothoracic spine well.   He will be out town x 1 week and will f/u with primary PT when he returns.  Patient will benefit from continued skilled therapeutic intervention to address the above deficits as needed for improved function and QoL with focus on transition to independent program over next 2-3 weeks given no significant symptoms at this time (pending no flare-up or regression in patient's condition).     REHAB POTENTIAL: Good   CLINICAL DECISION MAKING: Evolving/moderate complexity   EVALUATION COMPLEXITY: Moderate     GOALS:   SHORT TERM GOALS: Target date: 08/17/22   Pt will be independent with HEP to improve strength and decrease back pain to improve pain-free function at home and work. Baseline: 08/01/22: Baseline HEP initiated and discussed regular walking program.  Goal status: INITIAL     LONG TERM GOALS: Target date: 09/12/2022   Pt will increase FOTO to at least 76 to demonstrate significant improvement in function at home and work related to back pain  Baseline: 08/01/22: 67 Goal status: INITIAL   2.  Pt will decrease worst back pain to no greater than 2-3/10 on the NPRS in order to demonstrate clinically significant reduction in back pain. Baseline: 08/01/22: 10/10 at worst Goal status: INITIAL   3.   Pt will access full thoracolumbar AROM without reproduction of pain as needed for bending to retrieve low-lying item, reaching, bed mobility, and self-care ADLs e.g. dressing Baseline: 08/01/22: Motion loss with flexion, mild pain with bilateral lateral flexion Goal status: INITIAL   4.  Patient will have full week with no pain performing supine to sit and sit to stand from edge of bed as needed for home-level mobility Baseline: 08/01/22:  Significant pain with getting up from bed during flare-up period Goal status: INITIAL     PLAN: PT FREQUENCY: 1-2x/week   PT DURATION: 4-6 weeks   PLANNED INTERVENTIONS: Therapeutic exercises, Therapeutic activity, Neuromuscular re-education, Patient/Family education, Joint manipulation, Joint mobilization, Dry Needling, Electrical stimulation, Spinal manipulation, Spinal mobilization, Cryotherapy, Moist heat, Traction,  and Manual therapy   PLAN FOR NEXT SESSION: Thoracic mobility, posterior LE chain flexibility/extensibility, hip and posterior chain strengthening; mid to lower lumbar spine mobilization for lumbar spine stiffness, progress HEP with anticipated transition to HEP over 3-4 weeks pending no regression in patient's condition. Encourage gradual increase in physical activity with regular walking regimen.    Merdis Delay, PT, DPT, OCS  [id number]   Pincus Badder, PT 08/15/2022, 5:15 PM

## 2022-08-17 ENCOUNTER — Encounter: Payer: BLUE CROSS/BLUE SHIELD | Admitting: Physical Therapy

## 2022-08-22 ENCOUNTER — Encounter: Payer: BLUE CROSS/BLUE SHIELD | Admitting: Physical Therapy

## 2022-08-24 ENCOUNTER — Encounter: Payer: BLUE CROSS/BLUE SHIELD | Admitting: Physical Therapy

## 2022-08-29 ENCOUNTER — Ambulatory Visit: Payer: BC Managed Care – PPO | Attending: Anesthesiology | Admitting: Physical Therapy

## 2022-08-29 DIAGNOSIS — M5459 Other low back pain: Secondary | ICD-10-CM | POA: Insufficient documentation

## 2022-08-29 DIAGNOSIS — M6281 Muscle weakness (generalized): Secondary | ICD-10-CM | POA: Diagnosis not present

## 2022-08-29 NOTE — Therapy (Signed)
OUTPATIENT PHYSICAL THERAPY TREATMENT NOTE   Patient Name: Stanley Villarreal MRN: [id number] DOB:[birth year], 56 y.o., male Today's Date: 08/29/2022    END OF SESSION:   PT End of Session - 08/29/22 1516     Visit Number 5    Number of Visits 8    Date for PT Re-Evaluation 08/31/22    Authorization Type [employer]; visit limit 30 per year    Progress Note Due on Visit 8    PT Start Time 1514    PT Stop Time 1558    PT Time Calculation (min) 44 min    Activity Tolerance No increased pain;Patient tolerated treatment well    Behavior During Therapy [facility] for tasks assessed/performed              Past Medical History:  Diagnosis Date   COPD (chronic obstructive pulmonary disease) (HCC)    Depression    No past surgical history on file. There are no problems to display for this patient.    PCP: No primary care provider on file.   REFERRING PROVIDER: Windle Guard, MD   REFERRING DIAGNOSIS: M54.50 (ICD-10-CM) - Low back pain, unspecified   THERAPY DIAG: Other low back pain   Muscle weakness (generalized)   RATIONALE FOR EVALUATION AND TREATMENT: Rehabilitation   ONSET DATE: 06/29/2022   PERTINENT HISTORY: Pt is a 56 year old male with history of chronic low back pain with Hx of relief with steroid injections. Patient reports episodic flare-ups of low back pain associated with pop during performance of activities requiring leaning forward or forward and sideways (i.e. flexion + sidebend). Pt reports a matter of hours or a day where it is hard to perform sit to stand or move about his home when flare-up occurs. Pt reports Rx given by referring physician did seem to help. Patient reports his back feels "weak" presently. Patient reports intermittent tingling in his hands, but none in his lower extremities; he reports Hx of some sensory change in L thigh before, but that is not the case this time. Pt has history of major depression that is managed medically and is under control at  this time per patient.    Pain:  Pain Intensity: Present: 0/10, Best: 0/10, Worst: 10/10  Pain location: Axial low back, L paraspinal Pain Quality: sharp  Radiating: No  Numbness/Tingling: No Focal Weakness: Yes, some weakness in his back  Aggravating factors: transfer in/out of bed during transfer, leaning forward, leaning sideways, sitting Relieving factors: lying flat on back c legs bent  24-hour pain behavior: None History of prior back injury, pain, surgery, or therapy: Yes, history of prior lumbar spine injections  Falls: Has patient fallen in last 6 months? No Follow-up appointment with MD: Yes, pt follows up with MD in 3-4 weeks Imaging: Yes,   07/20/22 Lumbar spine radiograph Plain film radiograph of the lumbar spine demonstrates degenerative changes in the facet joints of the lower lumbar vertebrae, specifically L4-5 and L5-S1. Vertebral bodies with normal alignment. Intervertebral disc spaces grossly normal.    Prior level of function: Independent Occupational demands: Sale with parts for refuge, sometimes lifting heavy items  Hobbies: repairs around his home Red flags (bowel/bladder changes, saddle paresthesia, personal history of cancer, h/o spinal tumors, h/o compression fx, h/o abdominal aneurysm, abdominal pain, chills/fever, night sweats, nausea, vomiting, unrelenting pain, first onset of insidious LBP <20 y/o): Positive for:             -increased urgency to urinate   Precautions: None  Weight Bearing Restrictions: No   Living Environment Lives with: lives with their spouse and her son  Lives in: House/apartment     Patient Goals: Pt able to perform appropriate exercises to minimize episodic flare-ups of back pain        OBJECTIVE: (objective measures completed at initial evaluation unless otherwise dated)    Patient Surveys  FOTO 67, predicted outcome score of 63     Cognition Patient is oriented to person, place, and time.  Recent memory is intact.   Remote memory is intact.  Attention span and concentration are intact.  Expressive speech is intact.  Patient's fund of knowledge is within normal limits for educational level.                            Gross Musculoskeletal Assessment Tremor: None Bulk: Normal Tone: Normal No visible step-off along spinal column, no signs of scoliosis     GAIT: Wide BOS, overpronation of feet bilat     Posture: Lumbar lordosis: WNL Iliac crest height: Equal bilaterally Lumbar lateral shift: Negative Moderate self-selected kyphotic posture in sitting, fair posture in standing      AROM       AROM (Normal range in degrees) AROM  08/02/2022  Lumbar    Flexion (65) 80% (hamstring stretch)   Extension (30) 100%   Right lateral flexion (25) 100% (slight discomfort R flank)  Left lateral flexion (25) 100% (R flank discomfort)  Right rotation (30) 100%  Left rotation (30) 100%         Hip Right Left  Flexion (125) WNL WNL  Extension (15)      Abduction (40) WNL WNL  Adduction       Internal Rotation (45)      External Rotation (45) WNL WNL         Knee      Flexion (135)      Extension (0)             Ankle      Dorsiflexion (20)      Plantarflexion (50)      Inversion (35)      Eversion (15      (* = pain; Blank rows = not tested)     LE MMT:   MMT (out of 5) Right 08/02/2022 Left 08/02/2022  Hip flexion 4 4  Hip extension 4 4-  Hip abduction 4 4+  Hip adduction 5 5  Hip internal rotation      Hip external rotation      Knee flexion 5 5  Knee extension 5 5  Ankle dorsiflexion 5 5  Ankle plantarflexion      Ankle inversion      Ankle eversion      (* = pain; Blank rows = not tested)     Sensation Deferred     Reflexes Deferred     Muscle Length (updated on 08/03/22) Hamstrings: R: Positive, lacking 20 deg L: Positive, lacking 15 deg Ely (quadriceps): R: Positive L: Positive Thomas (hip flexors): R: WNL, L: WNL     Palpation   Location LEFT  RIGHT            Lumbar paraspinals 0 1  Quadratus Lumborum 0 0  Gluteus Maximus 0 0  Gluteus Medius 0 0  Deep hip external rotators 0 0  PSIS 0 0  Fortin's Area (SIJ)      Greater Trochanter      (  Blank rows = not tested) Graded on 0-4 scale (0 = no pain, 1 = pain, 2 = pain with wincing/grimacing/flinching, 3 = pain with withdrawal, 4 = unwilling to allow palpation), (Blank rows = not tested)     Passive Accessory Intervertebral Motion Pt denies reproduction of back pain with CPA L1-L5 and UPA bilaterally L1-L5. Generally, hypomobile throughout L3-L5 and T7-T12     SPECIAL TESTS Lumbar Radiculopathy and Discogenic: Centralization and Peripheralization (SN 92, -LR 0.12): Not examined Slump (SN 83, -LR 0.32): R: Negative L: Negative SLR (SN 92, -LR 0.29): R: Negative L:  Negative Crossed SLR (SP 90): R: Negative L: Negative   Facet Joint: Extension-Rotation (SN 100, -LR 0.0): R: Negative L: Negative   Lumbar Foraminal Stenosis: Lumbar quadrant (SN 70): R: Negative L: Negative   SIJ:  Thigh Thrust (SN 88, -LR 0.18) : R: Not examined L: Not examined SIJ posterior primary stress: Negative Sacral compression: Negative        TODAY'S TREATMENT     SUBJECTIVE: Patient reports moderate pain affecting his low back that started this past week when he was bending slightly forward and to his left. He states he didn't have usual pop. Patient reports R flank pain at arrival to PT. Patient reports completing exercises with exception of last week when he had gout (notable pain in great toe that has improved with Rx medication).  PAIN:  Are you having pain? Yes, 2/10 pain in R flank this afternoon    OBJECTIVE FINDINGS 08/29/22  AROM Lumbar flexion WNL Lumbar extension WNL  Lateral flexion: R WNL*, L WNL* Thoracolumbar rotation: R WNL, L WNL  *Indicates pain   Manual Therapy - for symptom modulation, soft tissue sensitivity and mobility, joint mobility, ROM    CPA L3-5 gr II for  pain control, 2x30sec at each level, gr III for mobility 2x30sec at each level UPA R L2-4 gr III for improved segmental mobility; 3x30 sec at each level  No tenderness to palpation along lumbar paraspinals or gluteal musculature today, only TTP along L4-S1 spinous processes  *not today* CPA T3-T10, gr III for improved mobility    Therapeutic Exercise - for improved soft tissue flexibility and extensibility as needed for ROM, improved strength as needed to improve performance of CKC activities/functional movements  Repeated extension in standing; 2x10 Repeated flexion in standing; 1x10   -Patient has no increase/decrease in pain or change in movement baselines following repeated movements today   Quadruped sidebend; x10 alternating each direction Cat Camel; x10 ea dir Bird dog; 1x8 alternating - heavy cueing for technique and maintenance of neutral spine Lower trunk rotation with QL bias; x10 with each LE - reviewed Bridge; 5 second hold x15    PATIENT EDUCATION: discussed use of quadruped sidebend with lying exercises at home. Discussed continued HEP.     *not today* Supine abdominal bracing x 10 Supine abdominal bracing with alternating LE march 2x10 Dying bug; supine; 2x10 alternating Sidelying Hip abduction; 2x10 on each side- held off due to pt still having post exercise soreness since last week, instructed to resume tomorrow as part of his HEP Open book, sidelying; 1x10 on each side Lower trunk rotations; x10 ea dir Alternating hip extension in prone; 2x10, alt with demonstration and verbal cueing for technique     PATIENT EDUCATION:  Education details: see above for patient education details. Demonstration and verbal/tactile cueing for correct exercise technique.  Person educated: Patient Education method: Explanation, Demonstration, and Handouts Education comprehension: verbalized understanding and  returned demonstration     HOME EXERCISE PROGRAM: Access Code:  [email or web address] URL: [email or web address] Date: 08/03/2022 Prepared by: Consuela Mimes   Exercises Access Code: [email or web address] URL: [email or web address] Date: 08/06/2022 Prepared by: Consuela Mimes  Exercises - Seated Hamstring Stretch  - 2 x daily - 7 x weekly - 3 sets - 30sec hold - Sidelying Thoracic Rotation with Open Book  - 1 x daily - 7 x weekly - 1 sets - 10 reps - 1-2sec hold - Supine Quadratus Lumborum Stretch  - 1 x daily - 7 x weekly - 2 sets - 10 reps - 1-2sec hold - Supine Bridge  - 1 x daily - 4 x weekly - 2 sets - 10 reps - Prone Hip Extension  - 1 x daily - 4 x weekly - 2 sets - 10 reps     ASSESSMENT:   CLINICAL IMPRESSION:  Patient has done well with current medical management and has tolerated his program well, though he did have significant soreness with hip abductor strengthening. Reviewed expectations for DOMS and modification of volume of experiencing debilitating soreness. Pt unfortunately experienced flare-up Sunday when seated and leaning forward and to his L; increase in R flank pain. Pt has no tenderness to palpation and no significant change in symptoms or ROM baselines with repeated movements of lumbar spine today (sagittal plane performed today). Pt has modest improvement in lateral flexion AROM tolerated following manual therapy and mobility work on table. Will continue to monitor patient's condition with upcoming PT session this week and will consider modifying plan for tapered visits prn. Patient will benefit from continued skilled therapeutic intervention to address the above deficits as needed for improved function and QoL with focus on transition to independent program over next 2 weeks pending no significant regression in patient's condition    REHAB POTENTIAL: Good   CLINICAL DECISION MAKING: Evolving/moderate complexity   EVALUATION COMPLEXITY: Moderate     GOALS:   SHORT TERM GOALS: Target date: 08/17/22   Pt will be  independent with HEP to improve strength and decrease back pain to improve pain-free function at home and work. Baseline: 08/01/22: Baseline HEP initiated and discussed regular walking program.  Goal status: INITIAL     LONG TERM GOALS: Target date: 09/12/2022   Pt will increase FOTO to at least 76 to demonstrate significant improvement in function at home and work related to back pain  Baseline: 08/01/22: 67 Goal status: INITIAL   2.  Pt will decrease worst back pain to no greater than 2-3/10 on the NPRS in order to demonstrate clinically significant reduction in back pain. Baseline: 08/01/22: 10/10 at worst Goal status: INITIAL   3.   Pt will access full thoracolumbar AROM without reproduction of pain as needed for bending to retrieve low-lying item, reaching, bed mobility, and self-care ADLs e.g. dressing Baseline: 08/01/22: Motion loss with flexion, mild pain with bilateral lateral flexion Goal status: INITIAL   4.  Patient will have full week with no pain performing supine to sit and sit to stand from edge of bed as needed for home-level mobility Baseline: 08/01/22: Significant pain with getting up from bed during flare-up period Goal status: INITIAL     PLAN: PT FREQUENCY: 1-2x/week   PT DURATION: 4-6 weeks   PLANNED INTERVENTIONS: Therapeutic exercises, Therapeutic activity, Neuromuscular re-education, Patient/Family education, Joint manipulation, Joint mobilization, Dry Needling, Electrical stimulation, Spinal manipulation, Spinal mobilization, Cryotherapy, Moist heat, Traction,  and Manual therapy   PLAN FOR NEXT SESSION: Thoracic mobility, posterior LE  chain flexibility/extensibility, hip and posterior chain strengthening; mid to lower lumbar spine mobilization for lumbar spine stiffness, progress HEP with anticipated transition to HEP over 3-4 weeks pending no regression in patient's condition. Encourage gradual increase in physical activity with regular walking regimen.     Valentina Gu, PT, DPT #[id number]  Eilleen Kempf, PT 08/29/2022, 3:16 PM

## 2022-08-31 ENCOUNTER — Encounter: Payer: Self-pay | Admitting: Physical Therapy

## 2022-08-31 ENCOUNTER — Ambulatory Visit: Payer: BC Managed Care – PPO | Admitting: Physical Therapy

## 2022-08-31 DIAGNOSIS — M5459 Other low back pain: Secondary | ICD-10-CM | POA: Diagnosis not present

## 2022-08-31 DIAGNOSIS — M6281 Muscle weakness (generalized): Secondary | ICD-10-CM | POA: Diagnosis not present

## 2022-08-31 NOTE — Therapy (Signed)
OUTPATIENT PHYSICAL THERAPY TREATMENT NOTE   Patient Name: Stanley Villarreal MRN: [id number] DOB:1966-07-02, 56 y.o., male Today's Date: 08/31/2022    END OF SESSION:   PT End of Session - 08/31/22 1514     Visit Number 6    Number of Visits 8    Date for PT Re-Evaluation 08/31/22    Authorization Type [employer]; visit limit 30 per year    Progress Note Due on Visit 8    PT Start Time [id number]    PT Stop Time 1600    PT Time Calculation (min) 44 min    Activity Tolerance No increased pain;Patient tolerated treatment well    Behavior During Therapy [facility] for tasks assessed/performed               Past Medical History:  Diagnosis Date   COPD (chronic obstructive pulmonary disease) ([place])    Depression    History reviewed. No pertinent surgical history. There are no problems to display for this patient.    PCP: No primary care provider on file.   REFERRING PROVIDER: Bebe Shaggy, MD   REFERRING DIAGNOSIS: M54.50 (ICD-10-CM) - Low back pain, unspecified   THERAPY DIAG: Other low back pain   Muscle weakness (generalized)   RATIONALE FOR EVALUATION AND TREATMENT: Rehabilitation   ONSET DATE: 06/29/2022   PERTINENT HISTORY: Pt is a 56 year old male with history of chronic low back pain with Hx of relief with steroid injections. Patient reports episodic flare-ups of low back pain associated with pop during performance of activities requiring leaning forward or forward and sideways (i.e. flexion + sidebend). Pt reports a matter of hours or a day where it is hard to perform sit to stand or move about his home when flare-up occurs. Pt reports Rx given by referring physician did seem to help. Patient reports his back feels "weak" presently. Patient reports intermittent tingling in his hands, but none in his lower extremities; he reports Hx of some sensory change in L thigh before, but that is not the case this time. Pt has history of major depression that is managed medically and is  under control at this time per patient.    Pain:  Pain Intensity: Present: 0/10, Best: 0/10, Worst: 10/10  Pain location: Axial low back, L paraspinal Pain Quality: sharp  Radiating: No  Numbness/Tingling: No Focal Weakness: Yes, some weakness in his back  Aggravating factors: transfer in/out of bed during transfer, leaning forward, leaning sideways, sitting Relieving factors: lying flat on back c legs bent  24-hour pain behavior: None History of prior back injury, pain, surgery, or therapy: Yes, history of prior lumbar spine injections  Falls: Has patient fallen in last 6 months? No Follow-up appointment with MD: Yes, pt follows up with MD in 3-4 weeks Imaging: Yes,   07/20/22 Lumbar spine radiograph Plain film radiograph of the lumbar spine demonstrates degenerative changes in the facet joints of the lower lumbar vertebrae, specifically L4-5 and L5-S1. Vertebral bodies with normal alignment. Intervertebral disc spaces grossly normal.    Prior level of function: Independent Occupational demands: Sale with parts for refuge, sometimes lifting heavy items  Hobbies: repairs around his home Red flags (bowel/bladder changes, saddle paresthesia, personal history of cancer, h/o spinal tumors, h/o compression fx, h/o abdominal aneurysm, abdominal pain, chills/fever, night sweats, nausea, vomiting, unrelenting pain, first onset of insidious LBP <20 y/o): Positive for:             -increased urgency to urinate   Precautions: None  Weight Bearing Restrictions: No   Living Environment Lives with: lives with their spouse and her son  Lives in: House/apartment     Patient Goals: Pt able to perform appropriate exercises to minimize episodic flare-ups of back pain        OBJECTIVE: (objective measures completed at initial evaluation unless otherwise dated)    Patient Surveys  FOTO 67, predicted outcome score of 35     Cognition Patient is oriented to person, place, and time.  Recent  memory is intact.  Remote memory is intact.  Attention span and concentration are intact.  Expressive speech is intact.  Patient's fund of knowledge is within normal limits for educational level.                            Gross Musculoskeletal Assessment Tremor: None Bulk: Normal Tone: Normal No visible step-off along spinal column, no signs of scoliosis     GAIT: Wide BOS, overpronation of feet bilat     Posture: Lumbar lordosis: WNL Iliac crest height: Equal bilaterally Lumbar lateral shift: Negative Moderate self-selected kyphotic posture in sitting, fair posture in standing      AROM       AROM (Normal range in degrees) AROM  08/02/2022  Lumbar    Flexion (65) 80% (hamstring stretch)   Extension (30) 100%   Right lateral flexion (25) 100% (slight discomfort R flank)  Left lateral flexion (25) 100% (R flank discomfort)  Right rotation (30) 100%  Left rotation (30) 100%         Hip Right Left  Flexion (125) WNL WNL  Extension (15)      Abduction (40) WNL WNL  Adduction       Internal Rotation (45)      External Rotation (45) WNL WNL         Knee      Flexion (135)      Extension (0)             Ankle      Dorsiflexion (20)      Plantarflexion (50)      Inversion (35)      Eversion (15      (* = pain; Blank rows = not tested)     LE MMT:   MMT (out of 5) Right 08/02/2022 Left 08/02/2022  Hip flexion 4 4  Hip extension 4 4-  Hip abduction 4 4+  Hip adduction 5 5  Hip internal rotation      Hip external rotation      Knee flexion 5 5  Knee extension 5 5  Ankle dorsiflexion 5 5  Ankle plantarflexion      Ankle inversion      Ankle eversion      (* = pain; Blank rows = not tested)     Sensation Deferred     Reflexes Deferred     Muscle Length (updated on 08/03/22) Hamstrings: R: Positive, lacking 20 deg L: Positive, lacking 15 deg E[place] (quadriceps): R: Positive L: Positive Thomas (hip flexors): R: WNL, L: WNL     Palpation    Location LEFT  RIGHT           Lumbar paraspinals 0 1  Quadratus Lumborum 0 0  Gluteus Maximus 0 0  Gluteus Medius 0 0  Deep hip external rotators 0 0  PSIS 0 0  Fortin's Area (SIJ)      Greater Trochanter      (  Blank rows = not tested) Graded on 0-4 scale (0 = no pain, 1 = pain, 2 = pain with wincing/grimacing/flinching, 3 = pain with withdrawal, 4 = unwilling to allow palpation), (Blank rows = not tested)     Passive Accessory Intervertebral Motion Pt denies reproduction of back pain with CPA L1-L5 and UPA bilaterally L1-L5. Generally, hypomobile throughout L3-L5 and T7-T12     SPECIAL TESTS Lumbar Radiculopathy and Discogenic: Centralization and Peripheralization (SN 92, -LR 0.12): Not examined Slump (SN 83, -LR 0.32): R: Negative L: Negative SLR (SN 92, -LR 0.29): R: Negative L:  Negative Crossed SLR (SP 90): R: Negative L: Negative   Facet Joint: Extension-Rotation (SN 100, -LR 0.0): R: Negative L: Negative   Lumbar Foraminal Stenosis: Lumbar quadrant (SN 70): R: Negative L: Negative   SIJ:  Thigh Thrust (SN 88, -LR 0.18) : R: Not examined L: Not examined SIJ posterior primary stress: Negative Sacral compression: Negative        TODAY'S TREATMENT     SUBJECTIVE: Patient reports still "feeling it" in R flank today, about 1-2/10 at arrival. Patient reports doing well with most ADLs in spite of this recent pain. Pt reports no recent flare-ups or significant increase in pain from that reported last visit.   PAIN:  Are you having pain? Yes, 1-2/10 pain in R flank this afternoon    OBJECTIVE FINDINGS 08/29/22  AROM Lateral flexion: R WNL*, L WNL Thoracolumbar rotation: R WNL, L WNL  *Indicates pain   Manual Therapy - for symptom modulation, soft tissue sensitivity and mobility, joint mobility, ROM    CPA L3-5 gr II for pain control, 2x30sec at each level, gr III for mobility 2x30sec at each level UPA R and L L2-4 gr III for improved segmental mobility;  3x30 sec at each level   DTM R QL for improved soft tissue sensitivity; x 3 minutes   *not today* CPA T3-T10, gr III for improved mobility    Therapeutic Exercise - for improved soft tissue flexibility and extensibility as needed for ROM, improved strength as needed to improve performance of CKC activities/functional movements  Quadruped sidebend; x10 alternating each direction Bird dog; 1x8 alternating - heavy cueing for technique and maintenance of neutral spine Lower trunk rotation with QL bias; x10 with each LE - reviewed Swiss ball bridge, Silver physioball; 2x10 Sit to stand; 2x8, with 8-lb physioball; off of edge of low mat, raised table  -adjusted table height given notable difficulty with sit to stand from low seat   Pallof press with Nautilus; 30-lbs; 2x10 facing each direction     PATIENT EDUCATION: discussed use of quadruped sidebend with lying exercises at home. Discussed continued HEP.     *not today* Cat Camel; x10 ea dir Bridge; 5 second hold x15 Repeated extension in standing; 2x10 Repeated flexion in standing; 1x10 Supine abdominal bracing x 10 Supine abdominal bracing with alternating LE march 2x10 Dying bug; supine; 2x10 alternating Sidelying Hip abduction; 2x10 on each side- held off due to pt still having post exercise soreness since last week, instructed to resume tomorrow as part of his HEP Open book, sidelying; 1x10 on each side Lower trunk rotations; x10 ea dir Alternating hip extension in prone; 2x10, alt with demonstration and verbal cueing for technique     PATIENT EDUCATION:  Education details: see above for patient education details. Demonstration and verbal/tactile cueing for correct exercise technique.  Person educated: Patient Education method: Explanation, Demonstration, and Handouts Education comprehension: verbalized understanding and returned demonstration  HOME EXERCISE PROGRAM: Access Code: [email or web address] URL:  [email or web address] Date: 08/03/2022 Prepared by: Consuela Mimes   Exercises Access Code: [email or web address] URL: [email or web address] Date: 08/06/2022 Prepared by: Consuela Mimes  Exercises - Seated Hamstring Stretch  - 2 x daily - 7 x weekly - 3 sets - 30sec hold - Sidelying Thoracic Rotation with Open Book  - 1 x daily - 7 x weekly - 1 sets - 10 reps - 1-2sec hold - Supine Quadratus Lumborum Stretch  - 1 x daily - 7 x weekly - 2 sets - 10 reps - 1-2sec hold - Supine Bridge  - 1 x daily - 4 x weekly - 2 sets - 10 reps - Prone Hip Extension  - 1 x daily - 4 x weekly - 2 sets - 10 reps     ASSESSMENT:   CLINICAL IMPRESSION:  Patient is able to access full thoracolumbar AROM at this time with mild discomfort along R flank at end-range of motion. [employer] mobilization for lower lumbar spine stiffness with CPA/UPAs with modest improvement in ability to move through lateral flexion ROM post-treatment. Pt is able to progress hip and trunk strengthening today without significant increase in symptoms. Will continue to monitor patient's condition with upcoming PT session this week and will continue with gradual transition to independent home program with pt planning to initiate gym-based regimen for regular exercise as well. Patient will benefit from continued skilled therapeutic intervention to address the above deficits as needed for improved function and QoL with focus on transition to independent program over next 2 weeks pending no significant regression in patient's condition    REHAB POTENTIAL: Good   CLINICAL DECISION MAKING: Evolving/moderate complexity   EVALUATION COMPLEXITY: Moderate     GOALS:   SHORT TERM GOALS: Target date: 08/17/22   Pt will be independent with HEP to improve strength and decrease back pain to improve pain-free function at home and work. Baseline: 08/01/22: Baseline HEP initiated and discussed regular walking program.  Goal  status: INITIAL     LONG TERM GOALS: Target date: 09/12/2022   Pt will increase FOTO to at least 76 to demonstrate significant improvement in function at home and work related to back pain  Baseline: 08/01/22: 67 Goal status: INITIAL   2.  Pt will decrease worst back pain to no greater than 2-3/10 on the NPRS in order to demonstrate clinically significant reduction in back pain. Baseline: 08/01/22: 10/10 at worst Goal status: INITIAL   3.   Pt will access full thoracolumbar AROM without reproduction of pain as needed for bending to retrieve low-lying item, reaching, bed mobility, and self-care ADLs e.g. dressing Baseline: 08/01/22: Motion loss with flexion, mild pain with bilateral lateral flexion Goal status: INITIAL   4.  Patient will have full week with no pain performing supine to sit and sit to stand from edge of bed as needed for home-level mobility Baseline: 08/01/22: Significant pain with getting up from bed during flare-up period Goal status: INITIAL     PLAN: PT FREQUENCY: 1-2x/week   PT DURATION: 4-6 weeks   PLANNED INTERVENTIONS: Therapeutic exercises, Therapeutic activity, Neuromuscular re-education, Patient/Family education, Joint manipulation, Joint mobilization, Dry Needling, Electrical stimulation, Spinal manipulation, Spinal mobilization, Cryotherapy, Moist heat, Traction,  and Manual therapy   PLAN FOR NEXT SESSION: Thoracic mobility, posterior LE chain flexibility/extensibility, hip and posterior chain strengthening; mid to lower lumbar spine mobilization for lumbar spine stiffness, progress HEP with anticipated transition to HEP over 3-4 weeks pending no regression in patient's condition.  Encourage gradual increase in physical activity with regular walking regimen.    Valentina Gu, PT, DPT #[id number]  Eilleen Kempf, PT 08/31/2022, 3:17 PM

## 2022-09-05 ENCOUNTER — Ambulatory Visit: Payer: BC Managed Care – PPO | Admitting: Physical Therapy

## 2022-09-07 ENCOUNTER — Encounter: Payer: BLUE CROSS/BLUE SHIELD | Admitting: Physical Therapy

## 2022-09-12 ENCOUNTER — Encounter: Payer: BLUE CROSS/BLUE SHIELD | Admitting: Physical Therapy

## 2022-09-14 ENCOUNTER — Encounter: Payer: BLUE CROSS/BLUE SHIELD | Admitting: Physical Therapy

## 2022-09-29 DIAGNOSIS — I10 Essential (primary) hypertension: Secondary | ICD-10-CM | POA: Diagnosis not present

## 2022-09-29 DIAGNOSIS — Z125 Encounter for screening for malignant neoplasm of prostate: Secondary | ICD-10-CM | POA: Diagnosis not present

## 2022-09-29 DIAGNOSIS — R7303 Prediabetes: Secondary | ICD-10-CM | POA: Diagnosis not present

## 2022-09-29 DIAGNOSIS — J449 Chronic obstructive pulmonary disease, unspecified: Secondary | ICD-10-CM | POA: Diagnosis not present

## 2022-09-29 DIAGNOSIS — Z0000 Encounter for general adult medical examination without abnormal findings: Secondary | ICD-10-CM | POA: Diagnosis not present

## 2022-09-29 DIAGNOSIS — E782 Mixed hyperlipidemia: Secondary | ICD-10-CM | POA: Diagnosis not present

## 2022-10-19 DIAGNOSIS — R059 Cough, unspecified: Secondary | ICD-10-CM | POA: Diagnosis not present

## 2022-10-19 DIAGNOSIS — Z20822 Contact with and (suspected) exposure to covid-19: Secondary | ICD-10-CM | POA: Diagnosis not present

## 2023-03-27 DIAGNOSIS — R7303 Prediabetes: Secondary | ICD-10-CM | POA: Diagnosis not present

## 2023-03-27 DIAGNOSIS — J449 Chronic obstructive pulmonary disease, unspecified: Secondary | ICD-10-CM | POA: Diagnosis not present

## 2023-03-27 DIAGNOSIS — F902 Attention-deficit hyperactivity disorder, combined type: Secondary | ICD-10-CM | POA: Diagnosis not present

## 2023-03-27 DIAGNOSIS — I10 Essential (primary) hypertension: Secondary | ICD-10-CM | POA: Diagnosis not present

## 2023-07-13 DIAGNOSIS — M25562 Pain in left knee: Secondary | ICD-10-CM | POA: Diagnosis not present

## 2023-10-12 DIAGNOSIS — Z0000 Encounter for general adult medical examination without abnormal findings: Secondary | ICD-10-CM | POA: Diagnosis not present

## 2023-10-12 DIAGNOSIS — R7303 Prediabetes: Secondary | ICD-10-CM | POA: Diagnosis not present

## 2023-10-12 DIAGNOSIS — M109 Gout, unspecified: Secondary | ICD-10-CM | POA: Diagnosis not present

## 2023-10-12 DIAGNOSIS — Z1211 Encounter for screening for malignant neoplasm of colon: Secondary | ICD-10-CM | POA: Diagnosis not present

## 2023-10-12 DIAGNOSIS — I10 Essential (primary) hypertension: Secondary | ICD-10-CM | POA: Diagnosis not present

## 2023-10-12 DIAGNOSIS — Z125 Encounter for screening for malignant neoplasm of prostate: Secondary | ICD-10-CM | POA: Diagnosis not present

## 2023-10-12 DIAGNOSIS — Z23 Encounter for immunization: Secondary | ICD-10-CM | POA: Diagnosis not present

## 2023-10-12 DIAGNOSIS — E782 Mixed hyperlipidemia: Secondary | ICD-10-CM | POA: Diagnosis not present

## 2024-04-16 DIAGNOSIS — R7303 Prediabetes: Secondary | ICD-10-CM | POA: Diagnosis not present

## 2024-04-16 DIAGNOSIS — M109 Gout, unspecified: Secondary | ICD-10-CM | POA: Diagnosis not present

## 2024-04-16 DIAGNOSIS — I10 Essential (primary) hypertension: Secondary | ICD-10-CM | POA: Diagnosis not present

## 2024-04-16 DIAGNOSIS — J449 Chronic obstructive pulmonary disease, unspecified: Secondary | ICD-10-CM | POA: Diagnosis not present

## 2024-04-16 DIAGNOSIS — F902 Attention-deficit hyperactivity disorder, combined type: Secondary | ICD-10-CM | POA: Diagnosis not present

## 2024-09-27 DIAGNOSIS — T2692XA Corrosion of left eye and adnexa, part unspecified, initial encounter: Secondary | ICD-10-CM | POA: Diagnosis not present

## 2024-09-27 DIAGNOSIS — T2691XA Corrosion of right eye and adnexa, part unspecified, initial encounter: Secondary | ICD-10-CM | POA: Diagnosis not present

## 2024-10-23 DIAGNOSIS — E782 Mixed hyperlipidemia: Secondary | ICD-10-CM | POA: Diagnosis not present

## 2024-10-23 DIAGNOSIS — F902 Attention-deficit hyperactivity disorder, combined type: Secondary | ICD-10-CM | POA: Diagnosis not present

## 2024-10-23 DIAGNOSIS — R7303 Prediabetes: Secondary | ICD-10-CM | POA: Diagnosis not present

## 2024-10-23 DIAGNOSIS — Z125 Encounter for screening for malignant neoplasm of prostate: Secondary | ICD-10-CM | POA: Diagnosis not present

## 2024-10-23 DIAGNOSIS — I10 Essential (primary) hypertension: Secondary | ICD-10-CM | POA: Diagnosis not present

## 2024-10-23 DIAGNOSIS — Z0000 Encounter for general adult medical examination without abnormal findings: Secondary | ICD-10-CM | POA: Diagnosis not present

## 2024-10-23 DIAGNOSIS — M109 Gout, unspecified: Secondary | ICD-10-CM | POA: Diagnosis not present

## 2024-10-23 DIAGNOSIS — J449 Chronic obstructive pulmonary disease, unspecified: Secondary | ICD-10-CM | POA: Diagnosis not present

## 2024-10-23 DIAGNOSIS — F411 Generalized anxiety disorder: Secondary | ICD-10-CM | POA: Diagnosis not present
